# Patient Record
Sex: Female | Born: 1970 | Race: White | Hispanic: No | Marital: Married | Smoking: Former smoker
Health system: Southern US, Community
[De-identification: ages and names within clinical notes are randomized; demographics above are authoritative.]

## PROBLEM LIST (undated history)

## (undated) DIAGNOSIS — I10 Essential (primary) hypertension: Secondary | ICD-10-CM

## (undated) DIAGNOSIS — G4733 Obstructive sleep apnea (adult) (pediatric): Secondary | ICD-10-CM

## (undated) DIAGNOSIS — E669 Obesity, unspecified: Secondary | ICD-10-CM

## (undated) DIAGNOSIS — D170 Benign lipomatous neoplasm of skin and subcutaneous tissue of head, face and neck: Secondary | ICD-10-CM

## (undated) DIAGNOSIS — E039 Hypothyroidism, unspecified: Secondary | ICD-10-CM

## (undated) HISTORY — PX: TUBAL LIGATION: SHX77

## (undated) HISTORY — PX: LIPOMA EXCISION: SHX5283

## (undated) HISTORY — DX: Essential (primary) hypertension: I10

## (undated) HISTORY — PX: WISDOM TOOTH EXTRACTION: SHX21

## (undated) HISTORY — DX: Benign lipomatous neoplasm of skin and subcutaneous tissue of head, face and neck: D17.0

---

## 2017-05-13 DIAGNOSIS — Y838 Other surgical procedures as the cause of abnormal reaction of the patient, or of later complication, without mention of misadventure at the time of the procedure: Secondary | ICD-10-CM | POA: Diagnosis not present

## 2017-05-13 DIAGNOSIS — L7634 Postprocedural seroma of skin and subcutaneous tissue following other procedure: Secondary | ICD-10-CM | POA: Diagnosis not present

## 2017-06-13 DIAGNOSIS — L7634 Postprocedural seroma of skin and subcutaneous tissue following other procedure: Secondary | ICD-10-CM | POA: Diagnosis not present

## 2017-06-15 DIAGNOSIS — R232 Flushing: Secondary | ICD-10-CM | POA: Diagnosis not present

## 2017-06-15 DIAGNOSIS — N951 Menopausal and female climacteric states: Secondary | ICD-10-CM | POA: Diagnosis not present

## 2017-07-04 DIAGNOSIS — R221 Localized swelling, mass and lump, neck: Secondary | ICD-10-CM | POA: Diagnosis not present

## 2017-07-04 DIAGNOSIS — S1093XA Contusion of unspecified part of neck, initial encounter: Secondary | ICD-10-CM | POA: Diagnosis not present

## 2017-07-04 DIAGNOSIS — L7634 Postprocedural seroma of skin and subcutaneous tissue following other procedure: Secondary | ICD-10-CM | POA: Diagnosis not present

## 2017-07-08 DIAGNOSIS — R300 Dysuria: Secondary | ICD-10-CM | POA: Diagnosis not present

## 2017-07-25 DIAGNOSIS — N390 Urinary tract infection, site not specified: Secondary | ICD-10-CM | POA: Diagnosis not present

## 2017-07-25 DIAGNOSIS — M545 Low back pain: Secondary | ICD-10-CM | POA: Diagnosis not present

## 2017-07-25 DIAGNOSIS — R319 Hematuria, unspecified: Secondary | ICD-10-CM | POA: Diagnosis not present

## 2017-09-01 DIAGNOSIS — M25559 Pain in unspecified hip: Secondary | ICD-10-CM | POA: Diagnosis not present

## 2017-09-01 DIAGNOSIS — M545 Low back pain: Secondary | ICD-10-CM | POA: Diagnosis not present

## 2017-09-01 DIAGNOSIS — R39198 Other difficulties with micturition: Secondary | ICD-10-CM | POA: Diagnosis not present

## 2017-09-05 DIAGNOSIS — R102 Pelvic and perineal pain: Secondary | ICD-10-CM | POA: Diagnosis not present

## 2017-09-05 DIAGNOSIS — Z6835 Body mass index (BMI) 35.0-35.9, adult: Secondary | ICD-10-CM | POA: Diagnosis not present

## 2017-09-07 DIAGNOSIS — N83202 Unspecified ovarian cyst, left side: Secondary | ICD-10-CM | POA: Diagnosis not present

## 2017-09-07 DIAGNOSIS — R102 Pelvic and perineal pain: Secondary | ICD-10-CM | POA: Diagnosis not present

## 2017-09-07 DIAGNOSIS — D251 Intramural leiomyoma of uterus: Secondary | ICD-10-CM | POA: Diagnosis not present

## 2018-03-02 ENCOUNTER — Encounter: Payer: Self-pay | Admitting: Family Medicine

## 2018-03-02 ENCOUNTER — Ambulatory Visit (INDEPENDENT_AMBULATORY_CARE_PROVIDER_SITE_OTHER): Payer: BLUE CROSS/BLUE SHIELD | Admitting: Family Medicine

## 2018-03-02 VITALS — BP 104/70 | HR 66 | Temp 98.3°F | Ht 63.25 in | Wt 195.3 lb

## 2018-03-02 DIAGNOSIS — Z13220 Encounter for screening for lipoid disorders: Secondary | ICD-10-CM | POA: Diagnosis not present

## 2018-03-02 DIAGNOSIS — I10 Essential (primary) hypertension: Secondary | ICD-10-CM | POA: Diagnosis not present

## 2018-03-02 DIAGNOSIS — Z8679 Personal history of other diseases of the circulatory system: Secondary | ICD-10-CM

## 2018-03-02 DIAGNOSIS — N951 Menopausal and female climacteric states: Secondary | ICD-10-CM | POA: Insufficient documentation

## 2018-03-02 DIAGNOSIS — R51 Headache: Secondary | ICD-10-CM | POA: Diagnosis not present

## 2018-03-02 DIAGNOSIS — R519 Headache, unspecified: Secondary | ICD-10-CM

## 2018-03-02 MED ORDER — PREDNISONE 50 MG PO TABS: 50.0000 mg | ORAL_TABLET | Freq: Every day | ORAL | 0 refills | Status: DC

## 2018-03-02 NOTE — Progress Notes (Signed)
BP 104/70 (BP Location: Left Arm, Patient Position: Sitting, Cuff Size: Normal)   Pulse 66   Temp 98.3 F (36.8 C) (Oral)   Ht 5' 3.25" (1.607 m)   Wt 195 lb 4.8 oz (88.6 kg)   SpO2 98%   BMI 34.32 kg/m    Subjective:    Patient ID: Julia Jenkins, female    DOB: 08-15-1970, 47 y.o.   MRN: [id number]  HPI: Julia Jenkins is a 47 y.o. female who presents today to establish care.   Chief Complaint  Patient presents with  . Establish Care    Was taking lisinopril. Ever since stopped taking BP medication, patient has had a lot of pressure in her head.    Has been having pressure in her head for about a week. Has been throughout her head. Feels like she has a headache, but doesn't usually have headaches. No stuffiness, no runny nose. Has been waking up a lot at night. No weight changes.  No known history of allergies. No changes in vision, feeling entirely well except her pressure in her head  HYPERTENSION- was on lisinopril in the past, unsure of her dose, has been off her medicine for about a year now. Was on medicine for about 3 months about that time.  Hypertension status: stable  Satisfied with current treatment? no Duration of hypertension: chronic BP monitoring frequency:  Couple of times a week, highest has been 135/85 BP medication side effects:  no Medication compliance: poor compliance Previous BP meds: lisinopril Aspirin: no Recurrent headaches: no Visual changes: no Palpitations: no Dyspnea: no Chest pain: no Lower extremity edema: no Dizzy/lightheaded: no   Active Ambulatory Problems    Diagnosis Date Noted  . Perimenopause 03/02/2018   Resolved Ambulatory Problems    Diagnosis Date Noted  . No Resolved Ambulatory Problems   Past Medical History:  Diagnosis Date  . Hypertension   . Lipoma of neck    Past Surgical History:  Procedure Laterality Date  . CESAREAN SECTION    . LIPOMA EXCISION     Neck  . TUBAL LIGATION    . WISDOM TOOTH EXTRACTION       Outpatient Encounter Medications as of 03/02/2018  Medication Sig  . predniSONE (DELTASONE) 50 MG tablet Take 1 tablet (50 mg total) by mouth daily with breakfast.   No facility-administered encounter medications on file as of 03/02/2018.    No Known Allergies  Social History   Socioeconomic History  . Marital status: Married    Spouse name: Not on file  . Number of children: Not on file  . Years of education: Not on file  . Highest education level: Not on file  Occupational History  . Not on file  Social Needs  . Financial resource strain: Not on file  . Food insecurity:    Worry: Not on file    Inability: Not on file  . Transportation needs:    Medical: Not on file    Non-medical: Not on file  Tobacco Use  . Smoking status: Never Smoker  . Smokeless tobacco: Never Used  Substance and Sexual Activity  . Alcohol use: Yes    Comment: Once or twice a month  . Drug use: Never  . Sexual activity: Not on file  Lifestyle  . Physical activity:    Days per week: Not on file    Minutes per session: Not on file  . Stress: Not on file  Relationships  . Social connections:  Talks on phone: Not on file    Gets together: Not on file    Attends religious service: Not on file    Active member of club or organization: Not on file    Attends meetings of clubs or organizations: Not on file    Relationship status: Not on file  Other Topics Concern  . Not on file  Social History Narrative  . Not on file   Family History  Problem Relation Age of Onset  . Heart disease Maternal Grandmother   . Hypertension Maternal Grandmother   . Heart disease Maternal Grandfather   . Hypertension Maternal Grandfather   . Heart disease Paternal Grandmother   . Hypertension Paternal Grandmother   . Heart disease Paternal Grandfather   . Heart attack Paternal Grandfather   . Hypertension Paternal Grandfather   . Hyperlipidemia Mother   . Heart disease Father   . Heart attack Father    . Prostate cancer Father   . Hypertension Father   . Drug abuse Sister   . Depression Sister   . COPD Sister   . Drug abuse Sister   . Schizophrenia Sister   . Personality disorder Sister     Review of Systems  Constitutional: Positive for fatigue. Negative for activity change, appetite change, chills, diaphoresis, fever and unexpected weight change.  HENT: Positive for sinus pressure and tinnitus. Negative for congestion, dental problem, drooling, ear discharge, ear pain, facial swelling, hearing loss, mouth sores, nosebleeds, postnasal drip, rhinorrhea, sinus pain, sneezing, sore throat, trouble swallowing and voice change.   Eyes: Negative.   Respiratory: Negative.   Cardiovascular: Negative.   Gastrointestinal: Negative.   Musculoskeletal: Negative.   Psychiatric/Behavioral: Negative for agitation, behavioral problems, confusion, decreased concentration, dysphoric mood, hallucinations, self-injury, sleep disturbance and suicidal ideas. The patient is nervous/anxious. The patient is not hyperactive.     Per HPI unless specifically indicated above     Objective:    BP 104/70 (BP Location: Left Arm, Patient Position: Sitting, Cuff Size: Normal)   Pulse 66   Temp 98.3 F (36.8 C) (Oral)   Ht 5' 3.25" (1.607 m)   Wt 195 lb 4.8 oz (88.6 kg)   SpO2 98%   BMI 34.32 kg/m   Wt Readings from Last 3 Encounters:  03/02/18 195 lb 4.8 oz (88.6 kg)    Physical Exam  Constitutional: She is oriented to person, place, and time. She appears well-developed and well-nourished. No distress.  HENT:  Head: Normocephalic and atraumatic.  Right Ear: Hearing and external ear normal.  Left Ear: Hearing and external ear normal.  Nose: Mucosal edema present.  Mouth/Throat: Oropharynx is clear and moist. No oropharyngeal exudate.  Eyes: Pupils are equal, round, and reactive to light. Conjunctivae, EOM and lids are normal. Right eye exhibits no discharge. Left eye exhibits no discharge. No scleral  icterus.  Neck: Neck supple. No JVD present. No tracheal deviation present. No thyromegaly present.  Hypertonic paraspinal muscles  Cardiovascular: Normal rate, regular rhythm, normal heart sounds and intact distal pulses. Exam reveals no gallop and no friction rub.  No murmur heard. Pulmonary/Chest: Effort normal and breath sounds normal. No stridor. No respiratory distress. She has no wheezes. She has no rales. She exhibits no tenderness.  Musculoskeletal: Normal range of motion.  Lymphadenopathy:    She has no cervical adenopathy.  Neurological: She is alert and oriented to person, place, and time.  Skin: Skin is warm, dry and intact. Capillary refill takes less than 2 seconds. No rash  noted. She is not diaphoretic. No erythema. No pallor.  Psychiatric: She has a normal mood and affect. Her speech is normal and behavior is normal. Judgment and thought content normal. Cognition and memory are normal.    No results found for this or any previous visit.    Assessment & Plan:   Problem List Items Addressed This Visit      Other   Perimenopause    To be doing a clinical study with [facility]. Stable right now. Continue to monitor. Call with any concerns.        Other Visit Diagnoses    Pressure in head    -  Primary   Unlikely due to BP. Will treat with burst of prednisone for ?allergies. Call if not getting better or getting worse. Continue to monitor.   History of hypertension       BP perfect today. Unlikely cause of pressure in her head. Checking labs.    Relevant Orders   CBC with Differential/Platelet   Comprehensive metabolic panel   Microalbumin, Urine Waived   TSH   UA/M w/rflx Culture, Routine   Screening for cholesterol level       Checking labs today.   Relevant Orders   Lipid Panel w/o Chol/HDL Ratio       Follow up plan: Return in about 6 months (around 08/31/2018) for Physical; Records release.

## 2018-03-02 NOTE — Assessment & Plan Note (Signed)
To be doing a clinical study with [facility]. Stable right now. Continue to monitor. Call with any concerns.

## 2018-03-03 ENCOUNTER — Telehealth: Payer: Self-pay | Admitting: Family Medicine

## 2018-03-03 LAB — UA/M W/RFLX CULTURE, ROUTINE
BILIRUBIN UA: NEGATIVE
GLUCOSE, UA: NEGATIVE
Leukocytes, UA: NEGATIVE
Nitrite, UA: NEGATIVE
PH UA: 5.5 (ref 5.0–7.5)
Protein, UA: NEGATIVE
RBC, UA: NEGATIVE
Specific Gravity, UA: 1.025 (ref 1.005–1.030)
UUROB: 0.2 mg/dL (ref 0.2–1.0)

## 2018-03-03 LAB — CBC WITH DIFFERENTIAL/PLATELET
Basophils Absolute: 0 10*3/uL (ref 0.0–0.2)
Basos: 1 %
EOS (ABSOLUTE): 0.1 10*3/uL (ref 0.0–0.4)
Eos: 2 %
Hematocrit: 40.1 % (ref 34.0–46.6)
Hemoglobin: 13.4 g/dL (ref 11.1–15.9)
IMMATURE GRANS (ABS): 0 10*3/uL (ref 0.0–0.1)
Immature Granulocytes: 0 %
LYMPHS ABS: 1.3 10*3/uL (ref 0.7–3.1)
LYMPHS: 23 %
MCH: 28.7 pg (ref 26.6–33.0)
MCHC: 33.4 g/dL (ref 31.5–35.7)
MCV: 86 fL (ref 79–97)
Monocytes Absolute: 0.6 10*3/uL (ref 0.1–0.9)
Monocytes: 11 %
NEUTROS ABS: 3.7 10*3/uL (ref 1.4–7.0)
Neutrophils: 63 %
PLATELETS: 266 10*3/uL (ref 150–450)
RBC: 4.67 x10E6/uL (ref 3.77–5.28)
RDW: 13.5 % (ref 12.3–15.4)
WBC: 5.8 10*3/uL (ref 3.4–10.8)

## 2018-03-03 LAB — COMPREHENSIVE METABOLIC PANEL
ALK PHOS: 95 IU/L (ref 39–117)
ALT: 19 IU/L (ref 0–32)
AST: 18 IU/L (ref 0–40)
Albumin/Globulin Ratio: 1.7 (ref 1.2–2.2)
Albumin: 4 g/dL (ref 3.5–5.5)
BILIRUBIN TOTAL: 0.2 mg/dL (ref 0.0–1.2)
BUN/Creatinine Ratio: 22 (ref 9–23)
BUN: 20 mg/dL (ref 6–24)
CHLORIDE: 104 mmol/L (ref 96–106)
CO2: 22 mmol/L (ref 20–29)
Calcium: 9 mg/dL (ref 8.7–10.2)
Creatinine, Ser: 0.91 mg/dL (ref 0.57–1.00)
GFR calc Af Amer: 86 mL/min/{1.73_m2} (ref >59)
GFR calc non Af Amer: 74 mL/min/{1.73_m2} (ref >59)
GLUCOSE: 86 mg/dL (ref 65–99)
Globulin, Total: 2.4 g/dL (ref 1.5–4.5)
Potassium: 4.3 mmol/L (ref 3.5–5.2)
Sodium: 141 mmol/L (ref 134–144)
Total Protein: 6.4 g/dL (ref 6.0–8.5)

## 2018-03-03 LAB — LIPID PANEL W/O CHOL/HDL RATIO
CHOLESTEROL TOTAL: 217 mg/dL — AB (ref 100–199)
HDL: 56 mg/dL (ref >39)
LDL Calculated: 140 mg/dL — ABNORMAL HIGH (ref 0–99)
Triglycerides: 105 mg/dL (ref 0–149)
VLDL CHOLESTEROL CAL: 21 mg/dL (ref 5–40)

## 2018-03-03 LAB — MICROALBUMIN, URINE WAIVED
Creatinine, Urine Waived: 200 mg/dL (ref 10–300)
Microalb, Ur Waived: 10 mg/L (ref 0–19)
Microalb/Creat Ratio: <30 mg/g (ref <30)

## 2018-03-03 LAB — TSH: TSH: 3.81 u[IU]/mL (ref 0.450–4.500)

## 2018-03-03 NOTE — Telephone Encounter (Signed)
Called patient, no answer, left a message for patient to return my call. [place] for nurse triage to discuss results. CRM created.

## 2018-03-03 NOTE — Telephone Encounter (Signed)
Please let her know that her labs came back nice and normal. Her cholesterol was a tiny bit high but not to where we'd put her on medicine. Everything else looks good.

## 2018-03-03 NOTE — Telephone Encounter (Signed)
Pt returned call and lab message given to her with verbal understanding.  

## 2018-03-07 ENCOUNTER — Telehealth: Payer: Self-pay | Admitting: Family Medicine

## 2018-03-07 NOTE — Telephone Encounter (Signed)
-----   Message from Valerie Roys, [place] sent at 03/02/2018  4:03 PM EDT ----- Call about head pressure

## 2018-03-07 NOTE — Telephone Encounter (Signed)
Called to check in on the pressure on her head. She notes that she has been spotting all week and is wondering if it has anything to do with that. Prednisone didn't really seem to help. She is otherwise feeling well with no other concerns at this time.

## 2018-05-16 ENCOUNTER — Other Ambulatory Visit: Payer: Self-pay

## 2018-05-16 ENCOUNTER — Encounter: Payer: Self-pay | Admitting: Family Medicine

## 2018-05-16 ENCOUNTER — Ambulatory Visit: Payer: BLUE CROSS/BLUE SHIELD | Admitting: Family Medicine

## 2018-05-16 VITALS — BP 130/85 | HR 62 | Temp 98.5°F | Ht 64.0 in | Wt 203.0 lb

## 2018-05-16 DIAGNOSIS — R229 Localized swelling, mass and lump, unspecified: Secondary | ICD-10-CM

## 2018-05-16 DIAGNOSIS — IMO0002 Reserved for concepts with insufficient information to code with codable children: Secondary | ICD-10-CM

## 2018-05-16 MED ORDER — NAPROXEN 500 MG PO TABS: 500.0000 mg | ORAL_TABLET | Freq: Two times a day (BID) | ORAL | 1 refills | Status: DC

## 2018-05-16 NOTE — Progress Notes (Signed)
BP 130/85   Pulse 62   Temp 98.5 F (36.9 C) (Oral)   Ht 5\' 4"  (1.626 m)   Wt 203 lb (92.1 kg)   SpO2 99%   BMI 34.84 kg/m    Subjective:    Patient ID: Julia Jenkins, female    DOB: October 04, 1970, 48 y.o.   MRN: [id number]  HPI: Julia Jenkins is a 48 y.o. female  Chief Complaint  Patient presents with  . Cyst    pt states has had a knot on her middle back for about 6 moth. states it hurts when put pressure on it   LUMP Duration: about 6 months ago Location: middle back Onset: gradual Painful: only with pushing on it Discomfort: yes Status:  bigger Trauma: no Redness: no Bruising: no Recent infection: no Swollen lymph nodes: no Requesting removal: yes History of cancer: no Family history of cancer: no History of the same: no   Relevant past medical, surgical, family and social history reviewed and updated as indicated. Interim medical history since our last visit reviewed. Allergies and medications reviewed and updated.  Review of Systems  Constitutional: Negative.   Respiratory: Negative.   Cardiovascular: Negative.   Musculoskeletal: Positive for arthralgias and myalgias. Negative for back pain, gait problem, joint swelling, neck pain and neck stiffness.  Skin: Negative for color change, pallor, rash and wound.  Psychiatric/Behavioral: Negative.     Per HPI unless specifically indicated above     Objective:    BP 130/85   Pulse 62   Temp 98.5 F (36.9 C) (Oral)   Ht 5\' 4"  (1.626 m)   Wt 203 lb (92.1 kg)   SpO2 99%   BMI 34.84 kg/m   Wt Readings from Last 3 Encounters:  05/16/18 203 lb (92.1 kg)  03/02/18 195 lb 4.8 oz (88.6 kg)    Physical Exam Vitals signs and nursing note reviewed.  Constitutional:      General: She is not in acute distress.    Appearance: Normal appearance. She is not ill-appearing, toxic-appearing or diaphoretic.  HENT:     Head: Normocephalic and atraumatic.     Right Ear: External ear normal.     Left Ear: External ear  normal.     Nose: Nose normal.     Mouth/Throat:     Mouth: Mucous membranes are moist.     Pharynx: Oropharynx is clear.  Eyes:     General: No scleral icterus.       Right eye: No discharge.        Left eye: No discharge.     Extraocular Movements: Extraocular movements intact.     Conjunctiva/sclera: Conjunctivae normal.     Pupils: Pupils are equal, round, and reactive to light.  Neck:     Musculoskeletal: Normal range of motion and neck supple.  Cardiovascular:     Rate and Rhythm: Normal rate and regular rhythm.     Pulses: Normal pulses.     Heart sounds: Normal heart sounds. No murmur. No friction rub. No gallop.   Pulmonary:     Effort: Pulmonary effort is normal. No respiratory distress.     Breath sounds: Normal breath sounds. No stridor. No wheezing, rhonchi or rales.  Chest:     Chest wall: No tenderness.  Musculoskeletal: Normal range of motion.  Skin:    General: Skin is warm and dry.     Capillary Refill: Capillary refill takes less than 2 seconds.     Coloration: Skin is not  jaundiced or pale.     Findings: No bruising, erythema, lesion or rash.     Comments: 1 inch lump in deep tissue about T9 midline back- slightly more to the R than L  Neurological:     General: No focal deficit present.     Mental Status: She is alert and oriented to person, place, and time. Mental status is at baseline.  Psychiatric:        Mood and Affect: Mood normal.        Behavior: Behavior normal.        Thought Content: Thought content normal.        Judgment: Judgment normal.     Results for orders placed or performed in visit on 03/02/18  CBC with Differential/Platelet  Result Value Ref Range   WBC 5.8 3.4 - 10.8 x10E3/uL   RBC 4.67 3.77 - 5.28 x10E6/uL   Hemoglobin 13.4 11.1 - 15.9 g/dL   Hematocrit 40.1 34.0 - 46.6 %   MCV 86 79 - 97 fL   MCH 28.7 26.6 - 33.0 pg   MCHC 33.4 31.5 - 35.7 g/dL   RDW 13.5 12.3 - 15.4 %   Platelets 266 150 - 450 x10E3/uL   Neutrophils  63 Not Estab. %   Lymphs 23 Not Estab. %   Monocytes 11 Not Estab. %   Eos 2 Not Estab. %   Basos 1 Not Estab. %   Neutrophils Absolute 3.7 1.4 - 7.0 x10E3/uL   Lymphocytes Absolute 1.3 0.7 - 3.1 x10E3/uL   Monocytes Absolute 0.6 0.1 - 0.9 x10E3/uL   EOS (ABSOLUTE) 0.1 0.0 - 0.4 x10E3/uL   Basophils Absolute 0.0 0.0 - 0.2 x10E3/uL   Immature Granulocytes 0 Not Estab. %   Immature Grans (Abs) 0.0 0.0 - 0.1 x10E3/uL  Comprehensive metabolic panel  Result Value Ref Range   Glucose 86 65 - 99 mg/dL   BUN 20 6 - 24 mg/dL   Creatinine, Ser 0.91 0.57 - 1.00 mg/dL   GFR calc non Af Amer 74 >59 mL/min/1.73   GFR calc Af Amer 86 >59 mL/min/1.73   BUN/Creatinine Ratio 22 9 - 23   Sodium 141 134 - 144 mmol/L   Potassium 4.3 3.5 - 5.2 mmol/L   Chloride 104 96 - 106 mmol/L   CO2 22 20 - 29 mmol/L   Calcium 9.0 8.7 - 10.2 mg/dL   Total Protein 6.4 6.0 - 8.5 g/dL   Albumin 4.0 3.5 - 5.5 g/dL   Globulin, Total 2.4 1.5 - 4.5 g/dL   Albumin/Globulin Ratio 1.7 1.2 - 2.2   Bilirubin Total 0.2 0.0 - 1.2 mg/dL   Alkaline Phosphatase 95 39 - 117 IU/L   AST 18 0 - 40 IU/L   ALT 19 0 - 32 IU/L  Lipid Panel w/o Chol/HDL Ratio  Result Value Ref Range   Cholesterol, Total 217 (H) 100 - 199 mg/dL   Triglycerides 105 0 - 149 mg/dL   HDL 56 >39 mg/dL   VLDL Cholesterol Cal 21 5 - 40 mg/dL   LDL Calculated 140 (H) 0 - 99 mg/dL  Microalbumin, Urine Waived  Result Value Ref Range   Microalb, Ur Waived 10 0 - 19 mg/L   Creatinine, Urine Waived 200 10 - 300 mg/dL   Microalb/Creat Ratio <30 <30 mg/g  TSH  Result Value Ref Range   TSH 3.810 0.450 - 4.500 uIU/mL  UA/M w/rflx Culture, Routine  Result Value Ref Range   Specific Gravity, UA 1.025 1.005 -  1.030   pH, UA 5.5 5.0 - 7.5   Color, UA Yellow Yellow   Appearance Ur Clear Clear   Leukocytes, UA Negative Negative   Protein, UA Negative Negative/Trace   Glucose, UA Negative Negative   Ketones, UA Trace (A) Negative   RBC, UA Negative Negative    Bilirubin, UA Negative Negative   Urobilinogen, Ur 0.2 0.2 - 1.0 mg/dL   Nitrite, UA Negative Negative      Assessment & Plan:   Problem List Items Addressed This Visit    None    Visit Diagnoses    Lump    -  Primary   Deep in tissue about 1 inch round. Will get [place] to evaluate. Will send to surgery if lipoma. Call with any concerns.    Relevant Orders   [place] CHEST SOFT TISSUE       Follow up plan: Return April, for physical.

## 2018-05-24 ENCOUNTER — Ambulatory Visit: Payer: BLUE CROSS/BLUE SHIELD

## 2018-05-31 ENCOUNTER — Ambulatory Visit
Admission: RE | Admit: 2018-05-31 | Discharge: 2018-05-31 | Disposition: A | Discharge status: Home / Self Care | Payer: BLUE CROSS/BLUE SHIELD | Source: Ambulatory Visit | Attending: Family Medicine | Admitting: Family Medicine

## 2018-05-31 DIAGNOSIS — R221 Localized swelling, mass and lump, neck: Secondary | ICD-10-CM | POA: Diagnosis not present

## 2018-05-31 DIAGNOSIS — R229 Localized swelling, mass and lump, unspecified: Secondary | ICD-10-CM | POA: Insufficient documentation

## 2018-05-31 DIAGNOSIS — IMO0002 Reserved for concepts with insufficient information to code with codable children: Secondary | ICD-10-CM

## 2018-06-01 ENCOUNTER — Telehealth: Payer: Self-pay | Admitting: Family Medicine

## 2018-06-01 DIAGNOSIS — M899 Disorder of bone, unspecified: Secondary | ICD-10-CM

## 2018-06-01 DIAGNOSIS — IMO0002 Reserved for concepts with insufficient information to code with codable children: Secondary | ICD-10-CM

## 2018-06-01 DIAGNOSIS — R229 Localized swelling, mass and lump, unspecified: Principal | ICD-10-CM

## 2018-06-01 NOTE — Telephone Encounter (Signed)
Called and left a detailed message for patient with results.  Dr.J- the radiologist said to do a MRI of the spine with and without contrast- either thoracic or lumbar depending on where the mass is located on the spine.

## 2018-06-01 NOTE — Telephone Encounter (Signed)
Order placed

## 2018-06-01 NOTE — Telephone Encounter (Signed)
Please let her know that her [place] looks like there was a mass there, but they can't tell what it was. I'm going to order an MRI so we can figure out what it is.   It's in the middle of the soft tissue of her back- can we call radiology and ask them what I should order to get that looked at. Thanks!

## 2018-06-16 ENCOUNTER — Encounter: Payer: Self-pay | Admitting: Family Medicine

## 2018-06-16 ENCOUNTER — Ambulatory Visit: Payer: BLUE CROSS/BLUE SHIELD | Admitting: Family Medicine

## 2018-06-16 ENCOUNTER — Other Ambulatory Visit: Payer: Self-pay

## 2018-06-16 VITALS — BP 122/81 | HR 59 | Temp 97.8°F | Ht 63.0 in | Wt 207.0 lb

## 2018-06-16 DIAGNOSIS — R1013 Epigastric pain: Secondary | ICD-10-CM | POA: Diagnosis not present

## 2018-06-16 DIAGNOSIS — N951 Menopausal and female climacteric states: Secondary | ICD-10-CM

## 2018-06-16 MED ORDER — SUCRALFATE 1 GM/10ML PO SUSP: 1.0000 g | Freq: Three times a day (TID) | ORAL | 0 refills | Status: DC

## 2018-06-16 NOTE — Assessment & Plan Note (Signed)
Heavy period today. Will monitor closely- if not getting better or getting worse, will get her into GYN.

## 2018-06-16 NOTE — Progress Notes (Signed)
BP 122/81   Pulse (!) 59   Temp 97.8 F (36.6 C) (Oral)   Ht 5\' 3"  (1.6 m)   Wt 207 lb (93.9 kg)   SpO2 100%   BMI 36.67 kg/m    Subjective:    Patient ID: Julia Jenkins, female    DOB: [birth year], 48 y.o.   MRN: [id number]  HPI: Julia Jenkins is a 48 y.o. female  Chief Complaint  Patient presents with  . Abdominal Pain    upper abdominal pain since last Friday  . Menstrual Problem    heavy menstrual period started on Monday   . Hands shaking    bilateral for about a week   Hadn't had a period in about 6-8 months and started on Monday, has been having a lot of discomfort with heavy bleeding and cramping. Notes that it is not a good feeling, and she is not feeling well at all with it.   ABDOMINAL PAIN  Duration: couple of days ago Onset: sudden Severity: severe Quality: sharp, stabbing Location:  epigastric  Episode duration: few minutes Radiation: no Frequency: waxing and waning Alleviating factors: pain medicine Aggravating factors: nothing Status: stable Treatments attempted: none Fever: no Nausea: yes Vomiting: no Weight loss: no Decreased appetite: yes Diarrhea: no Constipation: no Blood in stool: no Heartburn: yes Jaundice: no Rash: no Dysuria/urinary frequency: no Hematuria: no History of sexually transmitted disease: no Recurrent NSAID use: no   Relevant past medical, surgical, family and social history reviewed and updated as indicated. Interim medical history since our last visit reviewed. Allergies and medications reviewed and updated.  Review of Systems  Constitutional: Negative.   Respiratory: Negative.   Cardiovascular: Negative.   Genitourinary: Positive for menstrual problem, pelvic pain and vaginal bleeding. Negative for decreased urine volume, difficulty urinating, dyspareunia, dysuria, enuresis, flank pain, frequency, genital sores, hematuria, urgency, vaginal discharge and vaginal pain.  Musculoskeletal: Negative.   Neurological:  Positive for numbness. Negative for dizziness, tremors, seizures, syncope, facial asymmetry, speech difficulty, weakness, light-headedness and headaches.  Psychiatric/Behavioral: Negative.     Per HPI unless specifically indicated above     Objective:    BP 122/81   Pulse (!) 59   Temp 97.8 F (36.6 C) (Oral)   Ht 5\' 3"  (1.6 m)   Wt 207 lb (93.9 kg)   SpO2 100%   BMI 36.67 kg/m   Wt Readings from Last 3 Encounters:  06/16/18 207 lb (93.9 kg)  05/16/18 203 lb (92.1 kg)  03/02/18 195 lb 4.8 oz (88.6 kg)    Physical Exam Vitals signs and nursing note reviewed.  Constitutional:      General: She is not in acute distress.    Appearance: Normal appearance. She is not ill-appearing, toxic-appearing or diaphoretic.  HENT:     Head: Normocephalic and atraumatic.     Right Ear: External ear normal.     Left Ear: External ear normal.     Nose: Nose normal.     Mouth/Throat:     Mouth: Mucous membranes are moist.     Pharynx: Oropharynx is clear.  Eyes:     General: No scleral icterus.       Right eye: No discharge.        Left eye: No discharge.     Extraocular Movements: Extraocular movements intact.     Conjunctiva/sclera: Conjunctivae normal.     Pupils: Pupils are equal, round, and reactive to light.  Neck:     Musculoskeletal: Normal range of motion  and neck supple.  Cardiovascular:     Rate and Rhythm: Normal rate and regular rhythm.     Pulses: Normal pulses.     Heart sounds: Normal heart sounds. No murmur. No friction rub. No gallop.   Pulmonary:     Effort: Pulmonary effort is normal. No respiratory distress.     Breath sounds: Normal breath sounds. No stridor. No wheezing, rhonchi or rales.  Chest:     Chest wall: No tenderness.  Abdominal:     General: Abdomen is flat. Bowel sounds are normal. There is no distension or abdominal bruit. There are no signs of injury.     Palpations: Abdomen is soft. There is no shifting dullness, fluid wave, hepatomegaly,  splenomegaly, mass or pulsatile mass.     Tenderness: There is abdominal tenderness in the epigastric area. There is no right CVA tenderness, left CVA tenderness, guarding or rebound. Negative signs include Murphy's sign, Rovsing's sign, McBurney's sign, psoas sign and obturator sign.  Musculoskeletal: Normal range of motion.  Skin:    General: Skin is warm and dry.     Capillary Refill: Capillary refill takes less than 2 seconds.     Coloration: Skin is not jaundiced or pale.     Findings: No bruising, erythema, lesion or rash.  Neurological:     General: No focal deficit present.     Mental Status: She is alert and oriented to person, place, and time. Mental status is at baseline.  Psychiatric:        Mood and Affect: Mood normal.        Behavior: Behavior normal.        Thought Content: Thought content normal.        Judgment: Judgment normal.     Results for orders placed or performed in visit on 03/02/18  CBC with Differential/Platelet  Result Value Ref Range   WBC 5.8 3.4 - 10.8 x10E3/uL   RBC 4.67 3.77 - 5.28 x10E6/uL   Hemoglobin 13.4 11.1 - 15.9 g/dL   Hematocrit 40.1 34.0 - 46.6 %   MCV 86 79 - 97 fL   MCH 28.7 26.6 - 33.0 pg   MCHC 33.4 31.5 - 35.7 g/dL   RDW 13.5 12.3 - 15.4 %   Platelets 266 150 - 450 x10E3/uL   Neutrophils 63 Not Estab. %   Lymphs 23 Not Estab. %   Monocytes 11 Not Estab. %   Eos 2 Not Estab. %   Basos 1 Not Estab. %   Neutrophils Absolute 3.7 1.4 - 7.0 x10E3/uL   Lymphocytes Absolute 1.3 0.7 - 3.1 x10E3/uL   Monocytes Absolute 0.6 0.1 - 0.9 x10E3/uL   EOS (ABSOLUTE) 0.1 0.0 - 0.4 x10E3/uL   Basophils Absolute 0.0 0.0 - 0.2 x10E3/uL   Immature Granulocytes 0 Not Estab. %   Immature Grans (Abs) 0.0 0.0 - 0.1 x10E3/uL  Comprehensive metabolic panel  Result Value Ref Range   Glucose 86 65 - 99 mg/dL   BUN 20 6 - 24 mg/dL   Creatinine, Ser 0.91 0.57 - 1.00 mg/dL   GFR calc non Af Amer 74 >59 mL/min/1.73   GFR calc Af Amer 86 >59 mL/min/1.73     BUN/Creatinine Ratio 22 9 - 23   Sodium 141 134 - 144 mmol/L   Potassium 4.3 3.5 - 5.2 mmol/L   Chloride 104 96 - 106 mmol/L   CO2 22 20 - 29 mmol/L   Calcium 9.0 8.7 - 10.2 mg/dL   Total Protein 6.4  6.0 - 8.5 g/dL   Albumin 4.0 3.5 - 5.5 g/dL   Globulin, Total 2.4 1.5 - 4.5 g/dL   Albumin/Globulin Ratio 1.7 1.2 - 2.2   Bilirubin Total 0.2 0.0 - 1.2 mg/dL   Alkaline Phosphatase 95 39 - 117 IU/L   AST 18 0 - 40 IU/L   ALT 19 0 - 32 IU/L  Lipid Panel w/o Chol/HDL Ratio  Result Value Ref Range   Cholesterol, Total 217 (H) 100 - 199 mg/dL   Triglycerides 105 0 - 149 mg/dL   HDL 56 >39 mg/dL   VLDL Cholesterol Cal 21 5 - 40 mg/dL   LDL Calculated 140 (H) 0 - 99 mg/dL  Microalbumin, Urine Waived  Result Value Ref Range   Microalb, Ur Waived 10 0 - 19 mg/L   Creatinine, Urine Waived 200 10 - 300 mg/dL   Microalb/Creat Ratio <30 <30 mg/g  TSH  Result Value Ref Range   TSH 3.810 0.450 - 4.500 uIU/mL  UA/M w/rflx Culture, Routine  Result Value Ref Range   Specific Gravity, UA 1.025 1.005 - 1.030   pH, UA 5.5 5.0 - 7.5   Color, UA Yellow Yellow   Appearance Ur Clear Clear   Leukocytes, UA Negative Negative   Protein, UA Negative Negative/Trace   Glucose, UA Negative Negative   Ketones, UA Trace (A) Negative   RBC, UA Negative Negative   Bilirubin, UA Negative Negative   Urobilinogen, Ur 0.2 0.2 - 1.0 mg/dL   Nitrite, UA Negative Negative      Assessment & Plan:   Problem List Items Addressed This Visit      Other   Perimenopause    Heavy period today. Will monitor closely- if not getting better or getting worse, will get her into GYN.       Other Visit Diagnoses    Epigastric abdominal pain    -  Primary   Sounds like gastritis. Will check labs and start carafate. Call with any concerns. Await results.    Relevant Orders   CBC with Differential/Platelet   Comprehensive metabolic panel   H. pylori antibody, IgG([facility])       Follow up plan: Return if  symptoms worsen or fail to improve.

## 2018-06-19 ENCOUNTER — Telehealth: Payer: Self-pay | Admitting: Family Medicine

## 2018-06-19 ENCOUNTER — Ambulatory Visit
Admission: RE | Admit: 2018-06-19 | Discharge: 2018-06-19 | Disposition: A | Discharge status: Home / Self Care | Payer: BLUE CROSS/BLUE SHIELD | Source: Ambulatory Visit | Attending: Family Medicine | Admitting: Family Medicine

## 2018-06-19 DIAGNOSIS — R229 Localized swelling, mass and lump, unspecified: Secondary | ICD-10-CM | POA: Diagnosis not present

## 2018-06-19 DIAGNOSIS — M899 Disorder of bone, unspecified: Secondary | ICD-10-CM | POA: Diagnosis not present

## 2018-06-19 DIAGNOSIS — IMO0002 Reserved for concepts with insufficient information to code with codable children: Secondary | ICD-10-CM

## 2018-06-19 DIAGNOSIS — D171 Benign lipomatous neoplasm of skin and subcutaneous tissue of trunk: Secondary | ICD-10-CM

## 2018-06-19 DIAGNOSIS — M5124 Other intervertebral disc displacement, thoracic region: Secondary | ICD-10-CM | POA: Diagnosis not present

## 2018-06-19 LAB — CBC WITH DIFFERENTIAL/PLATELET
BASOS ABS: 0 10*3/uL (ref 0.0–0.2)
Basos: 1 %
EOS (ABSOLUTE): 0.1 10*3/uL (ref 0.0–0.4)
Eos: 3 %
Hematocrit: 39.2 % (ref 34.0–46.6)
Hemoglobin: 12.9 g/dL (ref 11.1–15.9)
Immature Grans (Abs): 0 10*3/uL (ref 0.0–0.1)
Immature Granulocytes: 0 %
Lymphocytes Absolute: 1 10*3/uL (ref 0.7–3.1)
Lymphs: 26 %
MCH: 29 pg (ref 26.6–33.0)
MCHC: 32.9 g/dL (ref 31.5–35.7)
MCV: 88 fL (ref 79–97)
Monocytes Absolute: 0.5 10*3/uL (ref 0.1–0.9)
Monocytes: 12 %
Neutrophils Absolute: 2.3 10*3/uL (ref 1.4–7.0)
Neutrophils: 58 %
Platelets: 227 10*3/uL (ref 150–450)
RBC: 4.45 x10E6/uL (ref 3.77–5.28)
RDW: 13.3 % (ref 11.7–15.4)
WBC: 4 10*3/uL (ref 3.4–10.8)

## 2018-06-19 LAB — COMPREHENSIVE METABOLIC PANEL
ALT: 15 IU/L (ref 0–32)
AST: 12 IU/L (ref 0–40)
Albumin/Globulin Ratio: 2.2 (ref 1.2–2.2)
Albumin: 4.1 g/dL (ref 3.8–4.8)
Alkaline Phosphatase: 85 IU/L (ref 39–117)
BUN/Creatinine Ratio: 30 — ABNORMAL HIGH (ref 9–23)
BUN: 19 mg/dL (ref 6–24)
Bilirubin Total: 0.2 mg/dL (ref 0.0–1.2)
CALCIUM: 9.1 mg/dL (ref 8.7–10.2)
CO2: 23 mmol/L (ref 20–29)
Chloride: 104 mmol/L (ref 96–106)
Creatinine, Ser: 0.64 mg/dL (ref 0.57–1.00)
GFR calc Af Amer: 121 mL/min/{1.73_m2} (ref >59)
GFR calc non Af Amer: 105 mL/min/{1.73_m2} (ref >59)
Globulin, Total: 1.9 g/dL (ref 1.5–4.5)
Glucose: 93 mg/dL (ref 65–99)
Potassium: 4.4 mmol/L (ref 3.5–5.2)
Sodium: 139 mmol/L (ref 134–144)
Total Protein: 6 g/dL (ref 6.0–8.5)

## 2018-06-19 LAB — H. PYLORI ANTIBODY, IGG: H. pylori, IgG AbS: 0.18 Index Value (ref 0.00–0.79)

## 2018-06-19 MED ORDER — GADOBUTROL 1 MMOL/ML IV SOLN
9.0000 mL | Freq: Once | INTRAVENOUS | Status: AC | PRN
  Administered 2018-06-19: 9 mL via INTRAVENOUS

## 2018-06-19 NOTE — Telephone Encounter (Signed)
Referral placed.

## 2018-06-19 NOTE — Telephone Encounter (Signed)
Patient notified of all results. Patient states she would like to go ahead and have the lipoma removed from her back.

## 2018-06-19 NOTE — Telephone Encounter (Signed)
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 I'm happy to refer her to surgery to have it removed if she would like (order pended).  Her labs came back normal. No sign of issues with bacteria in her gut and her blood count, electrolytes and liver function are all normal. She can keep taking the medicine and we'll see how she does. Thanks!

## 2018-06-26 ENCOUNTER — Ambulatory Visit: Payer: Self-pay | Admitting: Surgery

## 2018-11-24 ENCOUNTER — Ambulatory Visit: Payer: BC Managed Care – PPO | Admitting: Family Medicine

## 2018-11-24 ENCOUNTER — Encounter: Payer: Self-pay | Admitting: Family Medicine

## 2018-11-24 ENCOUNTER — Other Ambulatory Visit: Payer: Self-pay

## 2018-11-24 VITALS — BP 128/86 | HR 81 | Temp 98.4°F

## 2018-11-24 DIAGNOSIS — M25531 Pain in right wrist: Secondary | ICD-10-CM

## 2018-11-24 MED ORDER — TRIAMCINOLONE ACETONIDE 40 MG/ML IJ SUSP
40.0000 mg | Freq: Once | INTRAMUSCULAR | Status: AC
  Administered 2018-11-24: 40 mg via INTRAMUSCULAR

## 2018-11-24 MED ORDER — CYCLOBENZAPRINE HCL 5 MG PO TABS: 5.0000 mg | ORAL_TABLET | Freq: Every evening | ORAL | 0 refills | Status: DC | PRN

## 2018-11-24 NOTE — Progress Notes (Signed)
BP 128/86   Pulse 81   Temp 98.4 F (36.9 C) (Oral)   SpO2 98%    Subjective:    Patient ID: Julia Jenkins, female    DOB: 1971/01/25, 48 y.o.   MRN: [id number]  HPI: Julia Jenkins is a 48 y.o. female  Chief Complaint  Patient presents with  . Wrist Pain    R wrist, pt states she has been making lots of meals for people and has been hurting her wrist for a couple of weeks now.    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 Right wrist pain and lateral inflammation the past few weeks. Feels some muscle soreness going up into forearm additionally. No numbness, tingling, weakness, redness, heat, chills, fevers. Trying braces, massage, ice with minimal relief. Has not been taking anything OTC for sxs. Never happened to her before.   Relevant past medical, surgical, family and social history reviewed and updated as indicated. Interim medical history since our last visit reviewed. Allergies and medications reviewed and updated.  Review of Systems  Per HPI unless specifically indicated above     Objective:    BP 128/86   Pulse 81   Temp 98.4 F (36.9 C) (Oral)   SpO2 98%   Wt Readings from Last 3 Encounters:  06/16/18 207 lb (93.9 kg)  05/16/18 203 lb (92.1 kg)  03/02/18 195 lb 4.8 oz (88.6 kg)    Physical Exam Vitals signs and nursing note reviewed.  Constitutional:      Appearance: Normal appearance. She is not ill-appearing.  HENT:     Head: Atraumatic.  Eyes:     Extraocular Movements: Extraocular movements intact.     Conjunctiva/sclera: Conjunctivae normal.  Neck:     Musculoskeletal: Normal range of motion and neck supple.  Cardiovascular:     Rate and Rhythm: Normal rate and regular rhythm.     Pulses: Normal pulses.     Heart sounds: Normal heart sounds.  Pulmonary:     Effort: Pulmonary effort is normal.     Breath sounds: Normal breath sounds.  Musculoskeletal: Normal range of motion.      General: Swelling (right lateral wrist swelling present) and tenderness (right lateral wrist extending into forearm ttp) present. No signs of injury.  Skin:    General: Skin is warm and dry.  Neurological:     Mental Status: She is alert and oriented to person, place, and time.     Motor: No weakness.  Psychiatric:        Mood and Affect: Mood normal.        Thought Content: Thought content normal.        Judgment: Judgment normal.     Results for orders placed or performed in visit on 06/16/18  CBC with Differential/Platelet  Result Value Ref Range   WBC 4.0 3.4 - 10.8 x10E3/uL   RBC 4.45 3.77 - 5.28 x10E6/uL   Hemoglobin 12.9 11.1 - 15.9 g/dL   Hematocrit 39.2 34.0 - 46.6 %   MCV 88 79 - 97 fL   MCH 29.0 26.6 - 33.0 pg   MCHC 32.9 31.5 - 35.7 g/dL   RDW 13.3 11.7 - 15.4 %   Platelets 227 150 - 450 x10E3/uL   Neutrophils 58 Not Estab. %   Lymphs 26 Not Estab. %   Monocytes 12 Not Estab. %   Eos 3 Not Estab. %   Basos 1 Not Estab. %  Neutrophils Absolute 2.3 1.4 - 7.0 x10E3/uL   Lymphocytes Absolute 1.0 0.7 - 3.1 x10E3/uL   Monocytes Absolute 0.5 0.1 - 0.9 x10E3/uL   EOS (ABSOLUTE) 0.1 0.0 - 0.4 x10E3/uL   Basophils Absolute 0.0 0.0 - 0.2 x10E3/uL   Immature Granulocytes 0 Not Estab. %   Immature Grans (Abs) 0.0 0.0 - 0.1 x10E3/uL  Comprehensive metabolic panel  Result Value Ref Range   Glucose 93 65 - 99 mg/dL   BUN 19 6 - 24 mg/dL   Creatinine, Ser 0.64 0.57 - 1.00 mg/dL   GFR calc non Af Amer 105 >59 mL/min/1.73   GFR calc Af Amer 121 >59 mL/min/1.73   BUN/Creatinine Ratio 30 (H) 9 - 23   Sodium 139 134 - 144 mmol/L   Potassium 4.4 3.5 - 5.2 mmol/L   Chloride 104 96 - 106 mmol/L   CO2 23 20 - 29 mmol/L   Calcium 9.1 8.7 - 10.2 mg/dL   Total Protein 6.0 6.0 - 8.5 g/dL   Albumin 4.1 3.8 - 4.8 g/dL   Globulin, Total 1.9 1.5 - 4.5 g/dL   Albumin/Globulin Ratio 2.2 1.2 - 2.2   Bilirubin Total 0.2 0.0 - 1.2 mg/dL   Alkaline Phosphatase 85 39 - 117 IU/L   AST 12 0  - 40 IU/L   ALT 15 0 - 32 IU/L  H. pylori antibody, IgG([facility])  Result Value Ref Range   H. pylori, IgG AbS 0.18 0.00 - 0.79 Index Value      Assessment & Plan:   Problem List Items Addressed This Visit    None    Visit Diagnoses    Right wrist pain    -  Primary   Suspect tendonitis from overuse. Kenalog IM, flexeril prn, heat/ice, massage, compression brace, rest. Avoid lifting, twisting motions from wrist   Relevant Medications   triamcinolone acetonide (KENALOG-40) injection 40 mg (Completed)       Follow up plan: Return if symptoms worsen or fail to improve.

## 2018-11-28 ENCOUNTER — Ambulatory Visit (INDEPENDENT_AMBULATORY_CARE_PROVIDER_SITE_OTHER): Payer: BC Managed Care – PPO | Admitting: Family Medicine

## 2018-11-28 ENCOUNTER — Encounter: Payer: Self-pay | Admitting: Family Medicine

## 2018-11-28 ENCOUNTER — Other Ambulatory Visit: Payer: Self-pay

## 2018-11-28 VITALS — Temp 98.0°F | Ht 64.0 in | Wt 188.0 lb

## 2018-11-28 DIAGNOSIS — R110 Nausea: Secondary | ICD-10-CM

## 2018-11-28 DIAGNOSIS — R197 Diarrhea, unspecified: Secondary | ICD-10-CM

## 2018-11-28 MED ORDER — AMOXICILLIN-POT CLAVULANATE 875-125 MG PO TABS: 1.0000 | ORAL_TABLET | Freq: Two times a day (BID) | ORAL | 0 refills | Status: DC

## 2018-11-28 MED ORDER — PROMETHAZINE HCL 25 MG PO TABS: 25.0000 mg | ORAL_TABLET | Freq: Three times a day (TID) | ORAL | 0 refills | Status: DC | PRN

## 2018-11-28 NOTE — Progress Notes (Signed)
Temp 98 F (36.7 C) (Oral)   Ht 5\' 4"  (1.626 m)   Wt 188 lb (85.3 kg)   BMI 32.27 kg/m    Subjective:    Patient ID: Julia Jenkins, female    DOB: 1971/04/23, 47 y.o.   MRN: [id number]  HPI: Julia Jenkins is a 48 y.o. female  Chief Complaint  Patient presents with  . Nausea    Patient thinks it's food poisoning. Patient cooked organic chicken. Husband had symptoms too. Ongoing 3 days.  . Diarrhea  . Abdominal Pain    . This visit was completed via WebEx due to the restrictions of the COVID-19 pandemic. All issues as above were discussed and addressed. Physical exam was done as above through visual confirmation on WebEx. If it was felt that the patient should be evaluated in the office, they were directed there. The patient verbally consented to this visit. . Location of the patient: home . Location of the provider: home . Those involved with this call:  . Provider: Merrie Roof, PA-C . CMA: Merilyn Baba, [place] . Front Desk/Registration: Jill Side  . Time spent on call: 15 minutes with patient face to face via video conference. More than 50% of this time was spent in counseling and coordination of care. 5 minutes total spent in review of patient's record and preparation of their chart. I verified patient identity using two factors (patient name and date of birth). Patient consents verbally to being seen via telemedicine visit today.   3 day hx of fever, fatigue, diarrhea, nausea. Unable to keep down any foods, able to tolerate some liquids. Sxs started after eating a chicken that patient states she was concerned about prior to cooking. States she has since spoken with the person who gave it to her as it was farm raised and they endorsed some recent freezer issues where the chicken had been stored. Husband who ate the same meal also sick with same sxs. Taking pepto bismol with minimal relief. Denies cough, congestion, CP, SOB, sick contacts.   Relevant past medical, surgical, family and  social history reviewed and updated as indicated. Interim medical history since our last visit reviewed. Allergies and medications reviewed and updated.  Review of Systems  Per HPI unless specifically indicated above     Objective:    Temp 98 F (36.7 C) (Oral)   Ht 5\' 4"  (1.626 m)   Wt 188 lb (85.3 kg)   BMI 32.27 kg/m   Wt Readings from Last 3 Encounters:  11/28/18 188 lb (85.3 kg)  06/16/18 207 lb (93.9 kg)  05/16/18 203 lb (92.1 kg)    Physical Exam Vitals signs and nursing note reviewed.  Constitutional:      General: She is not in acute distress.    Appearance: Normal appearance.  HENT:     Head: Atraumatic.     Right Ear: External ear normal.     Left Ear: External ear normal.     Nose: Nose normal. No congestion.     Mouth/Throat:     Mouth: Mucous membranes are moist.     Pharynx: Oropharynx is clear. No posterior oropharyngeal erythema.  Eyes:     Extraocular Movements: Extraocular movements intact.     Conjunctiva/sclera: Conjunctivae normal.  Neck:     Musculoskeletal: Normal range of motion.  Cardiovascular:     Comments: Unable to assess via virtual visit Pulmonary:     Effort: Pulmonary effort is normal. No respiratory distress.  Abdominal:  Comments: Diffuse mild ttp per patient's self exam  Musculoskeletal: Normal range of motion.  Skin:    General: Skin is dry.     Findings: No erythema.  Neurological:     Mental Status: She is alert and oriented to person, place, and time.  Psychiatric:        Mood and Affect: Mood normal.        Thought Content: Thought content normal.        Judgment: Judgment normal.     Results for orders placed or performed in visit on 06/16/18  CBC with Differential/Platelet  Result Value Ref Range   WBC 4.0 3.4 - 10.8 x10E3/uL   RBC 4.45 3.77 - 5.28 x10E6/uL   Hemoglobin 12.9 11.1 - 15.9 g/dL   Hematocrit 39.2 34.0 - 46.6 %   MCV 88 79 - 97 fL   MCH 29.0 26.6 - 33.0 pg   MCHC 32.9 31.5 - 35.7 g/dL   RDW  13.3 11.7 - 15.4 %   Platelets 227 150 - 450 x10E3/uL   Neutrophils 58 Not Estab. %   Lymphs 26 Not Estab. %   Monocytes 12 Not Estab. %   Eos 3 Not Estab. %   Basos 1 Not Estab. %   Neutrophils Absolute 2.3 1.4 - 7.0 x10E3/uL   Lymphocytes Absolute 1.0 0.7 - 3.1 x10E3/uL   Monocytes Absolute 0.5 0.1 - 0.9 x10E3/uL   EOS (ABSOLUTE) 0.1 0.0 - 0.4 x10E3/uL   Basophils Absolute 0.0 0.0 - 0.2 x10E3/uL   Immature Granulocytes 0 Not Estab. %   Immature Grans (Abs) 0.0 0.0 - 0.1 x10E3/uL  Comprehensive metabolic panel  Result Value Ref Range   Glucose 93 65 - 99 mg/dL   BUN 19 6 - 24 mg/dL   Creatinine, Ser 0.64 0.57 - 1.00 mg/dL   GFR calc non Af Amer 105 >59 mL/min/1.73   GFR calc Af Amer 121 >59 mL/min/1.73   BUN/Creatinine Ratio 30 (H) 9 - 23   Sodium 139 134 - 144 mmol/L   Potassium 4.4 3.5 - 5.2 mmol/L   Chloride 104 96 - 106 mmol/L   CO2 23 20 - 29 mmol/L   Calcium 9.1 8.7 - 10.2 mg/dL   Total Protein 6.0 6.0 - 8.5 g/dL   Albumin 4.1 3.8 - 4.8 g/dL   Globulin, Total 1.9 1.5 - 4.5 g/dL   Albumin/Globulin Ratio 2.2 1.2 - 2.2   Bilirubin Total 0.2 0.0 - 1.2 mg/dL   Alkaline Phosphatase 85 39 - 117 IU/L   AST 12 0 - 40 IU/L   ALT 15 0 - 32 IU/L  H. pylori antibody, IgG([facility])  Result Value Ref Range   H. pylori, IgG AbS 0.18 0.00 - 0.79 Index Value      Assessment & Plan:   Problem List Items Addressed This Visit    None    Visit Diagnoses    Nausea    -  Primary   Diarrhea, unspecified type        Suspect food-bourne illness from potentially spoiled chicken. Very low suspicion for COVID 19, and patient declines testing. Will start phenergan prn for symptomatic relief, BRAT diet, rest, push fluids. Augmentin sent in case worsening given concern for possible salmonella. Will obtain stool studies if not better over the next week.     Follow up plan: Return if symptoms worsen or fail to improve.

## 2018-11-30 ENCOUNTER — Encounter: Payer: Self-pay | Admitting: Family Medicine

## 2019-02-12 ENCOUNTER — Ambulatory Visit (INDEPENDENT_AMBULATORY_CARE_PROVIDER_SITE_OTHER)
Admission: RE | Admit: 2019-02-12 | Discharge: 2019-02-12 | Disposition: A | Discharge status: Home / Self Care | Payer: Self-pay | Source: Ambulatory Visit

## 2019-02-12 DIAGNOSIS — H938X1 Other specified disorders of right ear: Secondary | ICD-10-CM

## 2019-02-12 DIAGNOSIS — H6981 Other specified disorders of Eustachian tube, right ear: Secondary | ICD-10-CM

## 2019-02-12 MED ORDER — CETIRIZINE HCL 10 MG PO CAPS: 10.0000 mg | ORAL_CAPSULE | Freq: Every day | ORAL | 0 refills | Status: DC

## 2019-02-12 MED ORDER — FLUTICASONE PROPIONATE 50 MCG/ACT NA SUSP: 1.0000 | Freq: Every day | NASAL | 0 refills | Status: DC

## 2019-02-12 NOTE — Discharge Instructions (Signed)
Flonase nasal spray 1-2 sprays in each nostril daily Daily cetirizine  Follow up in person if not improving worseneing, developing pain

## 2019-02-12 NOTE — ED Provider Notes (Signed)
Virtual Visit via Video Note:  Mariena Pala  initiated request for Telemedicine visit with [facility] team. I connected with Jannifer Franklin  on 02/12/2019 at 12:04 PM  for a synchronized telemedicine visit using a video enabled HIPPA compliant telemedicine application. I verified that I am speaking with Jannifer Franklin  using two identifiers. Chandlar Staebell C Nina Mondor, PA-C  was physically located in a [facility] site and Aviyanna Pizzolato was located at a different location.   The limitations of evaluation and management by telemedicine as well as the availability of in-person appointments were discussed. Patient was informed that she  may incur a bill ( including co-pay) for this virtual visit encounter. Deepti Handcock  expressed understanding and gave verbal consent to proceed with virtual visit.     History of Present Illness:Julia Jenkins  is a 48 y.o. female presents for evaluation of right ear fullness.  Patient states that over the past week she has had discomfort within her right ear.  States that it feels as if she has fluid in her ear and often feels the fluid moving around when she changes position.  She has tried draining her ear as well as popping it but has not had success.  She started to have similar sensation in her left ear beginning yesterday.  She denies any pain.  Denies associated congestion cough or sore throat.  Denies fevers chills body aches.  States that she has some slight discomfort behind her thighs.  She has tried naproxen.  Denies recent travel.      No Known Allergies   Past Medical History:  Diagnosis Date  . Hypertension   . Lipoma of neck      Social History   Tobacco Use  . Smoking status: Never Smoker  . Smokeless tobacco: Never Used  Substance Use Topics  . Alcohol use: Not Currently  . Drug use: Never        Observations/Objective: Physical Exam  Constitutional: She is oriented to person, place, and time and well-developed, well-nourished, and in  no distress. No distress.  HENT:  Head: Normocephalic and atraumatic.  Eyes: Right eye exhibits no discharge. Left eye exhibits no discharge.  Neck: Normal range of motion. No thyromegaly present.  Pulmonary/Chest: Effort normal. No respiratory distress.  Speaking in full sentences  Neurological: She is alert and oriented to person, place, and time.  Speech clear, face symmetric     Assessment and Plan:    ICD-10-CM   1. Ear fullness, right  H93.8X1   2. Eustachian tube dysfunction, right  H69.81      Symptoms most consistently lined up with eustachian tube dysfunction.  Will treat with Flonase x1 to 2 weeks.  Also initiate on cetirizine.  Seems less likely cerumen impaction given fluid movement sensation.  Without pain, do not suspect infection.  Advised to follow-up in person if symptoms not persisting or worsening to order to have direct exam of the ears.Discussed strict return precautions. Patient verbalized understanding and is agreeable with plan.   Follow Up Instructions:     I discussed the assessment and treatment plan with the patient. The patient was provided an opportunity to ask questions and all were answered. The patient agreed with the plan and demonstrated an understanding of the instructions.   The patient was advised to call back or seek an in-person evaluation if the symptoms worsen or if the condition fails to improve as anticipated.      Nairi Oswald C Tyliyah Mcmeekin,  PA-C  02/12/2019 12:04 PM         Debara Pickett C, PA-C 02/12/19 1205

## 2019-05-23 DIAGNOSIS — M62431 Contracture of muscle, right forearm: Secondary | ICD-10-CM | POA: Diagnosis not present

## 2019-05-23 DIAGNOSIS — S29012A Strain of muscle and tendon of back wall of thorax, initial encounter: Secondary | ICD-10-CM | POA: Diagnosis not present

## 2019-05-23 DIAGNOSIS — M62830 Muscle spasm of back: Secondary | ICD-10-CM | POA: Diagnosis not present

## 2019-05-23 DIAGNOSIS — M9903 Segmental and somatic dysfunction of lumbar region: Secondary | ICD-10-CM | POA: Diagnosis not present

## 2019-05-23 DIAGNOSIS — M9902 Segmental and somatic dysfunction of thoracic region: Secondary | ICD-10-CM | POA: Diagnosis not present

## 2019-05-23 DIAGNOSIS — M9907 Segmental and somatic dysfunction of upper extremity: Secondary | ICD-10-CM | POA: Diagnosis not present

## 2019-05-24 ENCOUNTER — Other Ambulatory Visit: Payer: Self-pay

## 2019-05-24 ENCOUNTER — Ambulatory Visit: Payer: BC Managed Care – PPO | Admitting: Family Medicine

## 2019-05-24 ENCOUNTER — Encounter: Payer: Self-pay | Admitting: Family Medicine

## 2019-05-24 VITALS — BP 142/86 | HR 73 | Temp 98.4°F

## 2019-05-24 DIAGNOSIS — M62830 Muscle spasm of back: Secondary | ICD-10-CM | POA: Diagnosis not present

## 2019-05-24 DIAGNOSIS — M9907 Segmental and somatic dysfunction of upper extremity: Secondary | ICD-10-CM | POA: Diagnosis not present

## 2019-05-24 DIAGNOSIS — M4724 Other spondylosis with radiculopathy, thoracic region: Secondary | ICD-10-CM

## 2019-05-24 DIAGNOSIS — M9903 Segmental and somatic dysfunction of lumbar region: Secondary | ICD-10-CM | POA: Diagnosis not present

## 2019-05-24 DIAGNOSIS — S29012A Strain of muscle and tendon of back wall of thorax, initial encounter: Secondary | ICD-10-CM | POA: Diagnosis not present

## 2019-05-24 DIAGNOSIS — M9902 Segmental and somatic dysfunction of thoracic region: Secondary | ICD-10-CM | POA: Diagnosis not present

## 2019-05-24 DIAGNOSIS — M62431 Contracture of muscle, right forearm: Secondary | ICD-10-CM | POA: Diagnosis not present

## 2019-05-24 DIAGNOSIS — R030 Elevated blood-pressure reading, without diagnosis of hypertension: Secondary | ICD-10-CM

## 2019-05-24 MED ORDER — PREDNISONE 10 MG PO TABS: ORAL_TABLET | ORAL | 0 refills | Status: DC

## 2019-05-24 MED ORDER — TRIAMCINOLONE ACETONIDE 40 MG/ML IJ SUSP
40.0000 mg | Freq: Once | INTRAMUSCULAR | Status: AC
  Administered 2019-05-24: 11:00:00 40 mg via INTRAMUSCULAR

## 2019-05-24 MED ORDER — TRAZODONE HCL 50 MG PO TABS: 25.0000 mg | ORAL_TABLET | Freq: Every evening | ORAL | 0 refills | Status: DC | PRN

## 2019-05-24 NOTE — Patient Instructions (Signed)
DASH Eating Plan DASH stands for "Dietary Approaches to Stop Hypertension." The DASH eating plan is a healthy eating plan that has been shown to reduce high blood pressure (hypertension). It may also reduce your risk for type 2 diabetes, heart disease, and stroke. The DASH eating plan may also help with weight loss. What are tips for following this plan?  General guidelines  Avoid eating more than 2,300 mg (milligrams) of salt (sodium) a day. If you have hypertension, you may need to reduce your sodium intake to 1,500 mg a day.  Limit alcohol intake to no more than 1 drink a day for nonpregnant women and 2 drinks a day for men. One drink equals 12 oz of beer, 5 oz of wine, or 1 oz of hard liquor.  Work with your health care provider to maintain a healthy body weight or to lose weight. Ask what an ideal weight is for you.  Get at least 30 minutes of exercise that causes your heart to beat faster (aerobic exercise) most days of the week. Activities may include walking, swimming, or biking.  Work with your health care provider or diet and nutrition specialist (dietitian) to adjust your eating plan to your individual calorie needs. Reading food labels   Check food labels for the amount of sodium per serving. Choose foods with less than 5 percent of the Daily Value of sodium. Generally, foods with less than 300 mg of sodium per serving fit into this eating plan.  To find whole grains, look for the word "whole" as the first word in the ingredient list. Shopping  Buy products labeled as "low-sodium" or "no salt added."  Buy fresh foods. Avoid canned foods and premade or frozen meals. Cooking  Avoid adding salt when cooking. Use salt-free seasonings or herbs instead of table salt or sea salt. Check with your health care provider or pharmacist before using salt substitutes.  Do not fry foods. Cook foods using healthy methods such as baking, boiling, grilling, and broiling instead.  Cook with  heart-healthy oils, such as olive, canola, soybean, or sunflower oil. Meal planning  Eat a balanced diet that includes: ? 5 or more servings of fruits and vegetables each day. At each meal, try to fill half of your plate with fruits and vegetables. ? Up to 6-8 servings of whole grains each day. ? Less than 6 oz of lean meat, poultry, or fish each day. A 3-oz serving of meat is about the same size as a deck of cards. One egg equals 1 oz. ? 2 servings of low-fat dairy each day. ? A serving of nuts, seeds, or beans 5 times each week. ? Heart-healthy fats. Healthy fats called Omega-3 fatty acids are found in foods such as flaxseeds and coldwater fish, like sardines, salmon, and mackerel.  Limit how much you eat of the following: ? Canned or prepackaged foods. ? Food that is high in trans fat, such as fried foods. ? Food that is high in saturated fat, such as fatty meat. ? Sweets, desserts, sugary drinks, and other foods with added sugar. ? Full-fat dairy products.  Do not salt foods before eating.  Try to eat at least 2 vegetarian meals each week.  Eat more home-cooked food and less restaurant, buffet, and fast food.  When eating at a restaurant, ask that your food be prepared with less salt or no salt, if possible. What foods are recommended? The items listed may not be a complete list. Talk with your dietitian about   what dietary choices are best for you. Grains Whole-grain or whole-wheat bread. Whole-grain or whole-wheat pasta. Brown rice. Oatmeal. Quinoa. Bulgur. Whole-grain and low-sodium cereals. Pita bread. Low-fat, low-sodium crackers. Whole-wheat flour tortillas. Vegetables Fresh or frozen vegetables (raw, steamed, roasted, or grilled). Low-sodium or reduced-sodium tomato and vegetable juice. Low-sodium or reduced-sodium tomato sauce and tomato paste. Low-sodium or reduced-sodium canned vegetables. Fruits All fresh, dried, or frozen fruit. Canned fruit in natural juice (without  added sugar). Meat and other protein foods Skinless chicken or turkey. Ground chicken or turkey. Pork with fat trimmed off. Fish and seafood. Egg whites. Dried beans, peas, or lentils. Unsalted nuts, nut butters, and seeds. Unsalted canned beans. Lean cuts of beef with fat trimmed off. Low-sodium, lean deli meat. Dairy Low-fat (1%) or fat-free (skim) milk. Fat-free, low-fat, or reduced-fat cheeses. Nonfat, low-sodium ricotta or cottage cheese. Low-fat or nonfat yogurt. Low-fat, low-sodium cheese. Fats and oils Soft margarine without trans fats. Vegetable oil. Low-fat, reduced-fat, or light mayonnaise and salad dressings (reduced-sodium). Canola, safflower, olive, soybean, and sunflower oils. Avocado. Seasoning and other foods Herbs. Spices. Seasoning mixes without salt. Unsalted popcorn and pretzels. Fat-free sweets. What foods are not recommended? The items listed may not be a complete list. Talk with your dietitian about what dietary choices are best for you. Grains Baked goods made with fat, such as croissants, muffins, or some breads. Dry pasta or rice meal packs. Vegetables Creamed or fried vegetables. Vegetables in a cheese sauce. Regular canned vegetables (not low-sodium or reduced-sodium). Regular canned tomato sauce and paste (not low-sodium or reduced-sodium). Regular tomato and vegetable juice (not low-sodium or reduced-sodium). Pickles. Olives. Fruits Canned fruit in a light or heavy syrup. Fried fruit. Fruit in cream or butter sauce. Meat and other protein foods Fatty cuts of meat. Ribs. Fried meat. Bacon. Sausage. Bologna and other processed lunch meats. Salami. Fatback. Hotdogs. Bratwurst. Salted nuts and seeds. Canned beans with added salt. Canned or smoked fish. Whole eggs or egg yolks. Chicken or turkey with skin. Dairy Whole or 2% milk, cream, and half-and-half. Whole or full-fat cream cheese. Whole-fat or sweetened yogurt. Full-fat cheese. Nondairy creamers. Whipped toppings.  Processed cheese and cheese spreads. Fats and oils Butter. Stick margarine. Lard. Shortening. Ghee. Bacon fat. Tropical oils, such as coconut, palm kernel, or palm oil. Seasoning and other foods Salted popcorn and pretzels. Onion salt, garlic salt, seasoned salt, table salt, and sea salt. Worcestershire sauce. Tartar sauce. Barbecue sauce. Teriyaki sauce. Soy sauce, including reduced-sodium. Steak sauce. Canned and packaged gravies. Fish sauce. Oyster sauce. Cocktail sauce. Horseradish that you find on the shelf. Ketchup. Mustard. Meat flavorings and tenderizers. Bouillon cubes. Hot sauce and Tabasco sauce. Premade or packaged marinades. Premade or packaged taco seasonings. Relishes. Regular salad dressings. Where to find more information:  National Heart, Lung, and Blood Institute: www.nhlbi.nih.gov  American Heart Association: www.heart.org Summary  The DASH eating plan is a healthy eating plan that has been shown to reduce high blood pressure (hypertension). It may also reduce your risk for type 2 diabetes, heart disease, and stroke.  With the DASH eating plan, you should limit salt (sodium) intake to 2,300 mg a day. If you have hypertension, you may need to reduce your sodium intake to 1,500 mg a day.  When on the DASH eating plan, aim to eat more fresh fruits and vegetables, whole grains, lean proteins, low-fat dairy, and heart-healthy fats.  Work with your health care provider or diet and nutrition specialist (dietitian) to adjust your eating plan to your   individual calorie needs. This information is not intended to replace advice given to you by your health care provider. Make sure you discuss any questions you have with your health care provider. Document Revised: 04/01/2017 Document Reviewed: 04/12/2016 Elsevier Patient Education  2020 [employer]  

## 2019-05-24 NOTE — Progress Notes (Signed)
BP (!) 142/86   Pulse 73   Temp 98.4 F (36.9 C)   SpO2 97%    Subjective:    Patient ID: Julia Jenkins, female    DOB: 1971-02-13, 49 y.o.   MRN: [id number]  HPI: Julia Jenkins is a 49 y.o. female  Chief Complaint  Patient presents with  . Hypertension  . Back Pain    Patient states that the pain is on the left side beside her spine, this is cauing her pain all the time and waking her up during the night.    ELEVATED BLOOD PRESSURE Duration of elevated BP: Couple of months BP monitoring frequency: a few times a week BP range: [id number] systolic Previous BP meds: yes- lisinopril, unsure dose, no side effects Recent stressors: no Family history of hypertension: yes Recurrent headaches: no Visual changes: no Palpitations: no  Dyspnea: no Chest pain: no Lower extremity edema: no Dizzy/lightheaded: no Transient ischemic attacks: no  BACK PAIN Duration: couple of months, 3-4 days where she couldn't sleep Mechanism of injury: lifting Location: Left and upper back Onset: sudden Severity: severe Quality: sharp Frequency: constant Radiation: none Aggravating factors: rest and sleep Alleviating factors: moving around Status: better Treatments attempted: ibuporfen, heat, chiropractor, deep tissue massage   Relief with NSAIDs?: no Nighttime pain:  yes Paresthesias / decreased sensation:  no Bowel / bladder incontinence:  no Fevers:  no Dysuria / urinary frequency:  no  Relevant past medical, surgical, family and social history reviewed and updated as indicated. Interim medical history since our last visit reviewed. Allergies and medications reviewed and updated.  Review of Systems  Constitutional: Negative.   Respiratory: Negative.   Cardiovascular: Negative.   Gastrointestinal: Negative.   Genitourinary: Negative.   Musculoskeletal: Positive for back pain and myalgias. Negative for arthralgias, gait problem, joint swelling, neck pain and neck stiffness.  Skin: Negative.     Neurological: Negative.   Psychiatric/Behavioral: Positive for sleep disturbance. Negative for agitation, behavioral problems, confusion, decreased concentration, dysphoric mood, hallucinations, self-injury and suicidal ideas. The patient is nervous/anxious. The patient is not hyperactive.     Per HPI unless specifically indicated above     Objective:    BP (!) 142/86   Pulse 73   Temp 98.4 F (36.9 C)   SpO2 97%   Wt Readings from Last 3 Encounters:  11/28/18 188 lb (85.3 kg)  06/16/18 207 lb (93.9 kg)  05/16/18 203 lb (92.1 kg)    Physical Exam Vitals and nursing note reviewed.  Constitutional:      General: She is not in acute distress.    Appearance: Normal appearance. She is not ill-appearing, toxic-appearing or diaphoretic.  HENT:     Head: Normocephalic and atraumatic.     Right Ear: External ear normal.     Left Ear: External ear normal.     Nose: Nose normal.     Mouth/Throat:     Mouth: Mucous membranes are moist.     Pharynx: Oropharynx is clear.  Eyes:     General: No scleral icterus.       Right eye: No discharge.        Left eye: No discharge.     Extraocular Movements: Extraocular movements intact.     Conjunctiva/sclera: Conjunctivae normal.     Pupils: Pupils are equal, round, and reactive to light.  Cardiovascular:     Rate and Rhythm: Normal rate and regular rhythm.     Pulses: Normal pulses.     Heart sounds:  Normal heart sounds. No murmur. No friction rub. No gallop.   Pulmonary:     Effort: Pulmonary effort is normal. No respiratory distress.     Breath sounds: Normal breath sounds. No stridor. No wheezing, rhonchi or rales.  Chest:     Chest wall: No tenderness.  Musculoskeletal:        General: Normal range of motion.     Cervical back: Normal range of motion and neck supple.     Comments: Tenderness T10 just L of midline  Skin:    General: Skin is warm and dry.     Capillary Refill: Capillary refill takes less than 2 seconds.      Coloration: Skin is not jaundiced or pale.     Findings: No bruising, erythema, lesion or rash.  Neurological:     General: No focal deficit present.     Mental Status: She is alert and oriented to person, place, and time. Mental status is at baseline.     Cranial Nerves: No cranial nerve deficit.     Sensory: No sensory deficit.     Motor: No weakness.     Coordination: Coordination normal.     Gait: Gait normal.     Deep Tendon Reflexes: Reflexes normal.  Psychiatric:        Mood and Affect: Mood normal.        Behavior: Behavior normal.        Thought Content: Thought content normal.        Judgment: Judgment normal.     Results for orders placed or performed in visit on 06/16/18  CBC with Differential/Platelet  Result Value Ref Range   WBC 4.0 3.4 - 10.8 x10E3/uL   RBC 4.45 3.77 - 5.28 x10E6/uL   Hemoglobin 12.9 11.1 - 15.9 g/dL   Hematocrit 39.2 34.0 - 46.6 %   MCV 88 79 - 97 fL   MCH 29.0 26.6 - 33.0 pg   MCHC 32.9 31.5 - 35.7 g/dL   RDW 13.3 11.7 - 15.4 %   Platelets 227 150 - 450 x10E3/uL   Neutrophils 58 Not Estab. %   Lymphs 26 Not Estab. %   Monocytes 12 Not Estab. %   Eos 3 Not Estab. %   Basos 1 Not Estab. %   Neutrophils Absolute 2.3 1.4 - 7.0 x10E3/uL   Lymphocytes Absolute 1.0 0.7 - 3.1 x10E3/uL   Monocytes Absolute 0.5 0.1 - 0.9 x10E3/uL   EOS (ABSOLUTE) 0.1 0.0 - 0.4 x10E3/uL   Basophils Absolute 0.0 0.0 - 0.2 x10E3/uL   Immature Granulocytes 0 Not Estab. %   Immature Grans (Abs) 0.0 0.0 - 0.1 x10E3/uL  Comprehensive metabolic panel  Result Value Ref Range   Glucose 93 65 - 99 mg/dL   BUN 19 6 - 24 mg/dL   Creatinine, Ser 0.64 0.57 - 1.00 mg/dL   GFR calc non Af Amer 105 >59 mL/min/1.73   GFR calc Af Amer 121 >59 mL/min/1.73   BUN/Creatinine Ratio 30 (H) 9 - 23   Sodium 139 134 - 144 mmol/L   Potassium 4.4 3.5 - 5.2 mmol/L   Chloride 104 96 - 106 mmol/L   CO2 23 20 - 29 mmol/L   Calcium 9.1 8.7 - 10.2 mg/dL   Total Protein 6.0 6.0 - 8.5 g/dL     Albumin 4.1 3.8 - 4.8 g/dL   Globulin, Total 1.9 1.5 - 4.5 g/dL   Albumin/Globulin Ratio 2.2 1.2 - 2.2   Bilirubin Total 0.2 0.0 -  1.2 mg/dL   Alkaline Phosphatase 85 39 - 117 IU/L   AST 12 0 - 40 IU/L   ALT 15 0 - 32 IU/L  H. pylori antibody, IgG([facility])  Result Value Ref Range   H. pylori, IgG AbS 0.18 0.00 - 0.79 Index Value      Assessment & Plan:   Problem List Items Addressed This Visit      Nervous and Auditory   Osteoarthritis of spine with radiculopathy, thoracic region - Primary    Now with significant pain. Kenalog shot today, prednisone starting tomorrow. Continue chiropractry and get started with PT. Call if worsening and recheck 2 weeks.       Relevant Medications   predniSONE (DELTASONE) 10 MG tablet   traZODone (DESYREL) 50 MG tablet   Other Relevant Orders   Ambulatory referral to Physical Therapy    Other Visit Diagnoses    Elevated BP without diagnosis of hypertension       Likely due to pain and lack of sleep- will treat pain and work on [place]- recheck in 2 weeks. Call with any concerns.        Follow up plan: Return in about 2 weeks (around 06/07/2019).

## 2019-05-24 NOTE — Assessment & Plan Note (Signed)
Now with significant pain. Kenalog shot today, prednisone starting tomorrow. Continue chiropractry and get started with PT. Call if worsening and recheck 2 weeks.

## 2019-05-28 DIAGNOSIS — M9902 Segmental and somatic dysfunction of thoracic region: Secondary | ICD-10-CM | POA: Diagnosis not present

## 2019-05-28 DIAGNOSIS — M62830 Muscle spasm of back: Secondary | ICD-10-CM | POA: Diagnosis not present

## 2019-05-28 DIAGNOSIS — S29012A Strain of muscle and tendon of back wall of thorax, initial encounter: Secondary | ICD-10-CM | POA: Diagnosis not present

## 2019-05-28 DIAGNOSIS — M9903 Segmental and somatic dysfunction of lumbar region: Secondary | ICD-10-CM | POA: Diagnosis not present

## 2019-05-28 DIAGNOSIS — M9907 Segmental and somatic dysfunction of upper extremity: Secondary | ICD-10-CM | POA: Diagnosis not present

## 2019-05-28 DIAGNOSIS — M62431 Contracture of muscle, right forearm: Secondary | ICD-10-CM | POA: Diagnosis not present

## 2019-05-30 ENCOUNTER — Telehealth: Payer: Self-pay | Admitting: Family Medicine

## 2019-05-30 NOTE — Telephone Encounter (Signed)
Copied from [place] [id number]. Topic: General - Inquiry >> May 30, 2019  4:26 PM Alease Frame wrote: Reason for CRM: Georgeann Oppenheim from [facility] called to receive a call back from Dr Wynetta Emery. She wanted to say thank you for the referral   Call back [phone]

## 2019-05-30 NOTE — Telephone Encounter (Signed)
Noted. Do I need to call them back?

## 2019-06-04 DIAGNOSIS — M9907 Segmental and somatic dysfunction of upper extremity: Secondary | ICD-10-CM | POA: Diagnosis not present

## 2019-06-04 DIAGNOSIS — M62431 Contracture of muscle, right forearm: Secondary | ICD-10-CM | POA: Diagnosis not present

## 2019-06-04 DIAGNOSIS — S29012A Strain of muscle and tendon of back wall of thorax, initial encounter: Secondary | ICD-10-CM | POA: Diagnosis not present

## 2019-06-04 DIAGNOSIS — M9902 Segmental and somatic dysfunction of thoracic region: Secondary | ICD-10-CM | POA: Diagnosis not present

## 2019-06-04 DIAGNOSIS — M9903 Segmental and somatic dysfunction of lumbar region: Secondary | ICD-10-CM | POA: Diagnosis not present

## 2019-06-04 DIAGNOSIS — M62830 Muscle spasm of back: Secondary | ICD-10-CM | POA: Diagnosis not present

## 2019-06-13 DIAGNOSIS — M62830 Muscle spasm of back: Secondary | ICD-10-CM | POA: Diagnosis not present

## 2019-06-13 DIAGNOSIS — M9907 Segmental and somatic dysfunction of upper extremity: Secondary | ICD-10-CM | POA: Diagnosis not present

## 2019-06-13 DIAGNOSIS — S29012A Strain of muscle and tendon of back wall of thorax, initial encounter: Secondary | ICD-10-CM | POA: Diagnosis not present

## 2019-06-13 DIAGNOSIS — M9902 Segmental and somatic dysfunction of thoracic region: Secondary | ICD-10-CM | POA: Diagnosis not present

## 2019-06-13 DIAGNOSIS — M62431 Contracture of muscle, right forearm: Secondary | ICD-10-CM | POA: Diagnosis not present

## 2019-06-13 DIAGNOSIS — M9903 Segmental and somatic dysfunction of lumbar region: Secondary | ICD-10-CM | POA: Diagnosis not present

## 2019-06-16 ENCOUNTER — Other Ambulatory Visit: Payer: Self-pay | Admitting: Family Medicine

## 2019-07-16 ENCOUNTER — Encounter: Payer: Self-pay | Admitting: Family Medicine

## 2019-07-16 ENCOUNTER — Telehealth (INDEPENDENT_AMBULATORY_CARE_PROVIDER_SITE_OTHER): Payer: BC Managed Care – PPO | Admitting: Family Medicine

## 2019-07-16 VITALS — BP 159/96 | HR 76

## 2019-07-16 DIAGNOSIS — R0683 Snoring: Secondary | ICD-10-CM | POA: Diagnosis not present

## 2019-07-16 DIAGNOSIS — I10 Essential (primary) hypertension: Secondary | ICD-10-CM | POA: Diagnosis not present

## 2019-07-16 DIAGNOSIS — Z780 Asymptomatic menopausal state: Secondary | ICD-10-CM | POA: Diagnosis not present

## 2019-07-16 MED ORDER — LISINOPRIL 10 MG PO TABS: 10.0000 mg | ORAL_TABLET | Freq: Every day | ORAL | 3 refills | Status: DC

## 2019-07-16 MED ORDER — DULOXETINE HCL 20 MG PO CPEP: 20.0000 mg | ORAL_CAPSULE | Freq: Every day | ORAL | 3 refills | Status: DC

## 2019-07-16 NOTE — Progress Notes (Signed)
BP (!) 159/96   Pulse 76    Subjective:    Patient ID: Julia Jenkins, female    DOB: [birth year], 49 y.o.   MRN: [id number]  HPI: Julia Jenkins is a 49 y.o. female  Chief Complaint  Patient presents with  . Hypertension    pt states she has been having elevated BP readings, very sleepy and pressure in her head  . Menopause    pt states she has been having trouble remembering and feeling "foggy brained"    ELEVATED BLOOD PRESSURE Duration of elevated BP: about 2 months BP monitoring frequency: a few times a week BP range: 150s/90s Previous BP meds: no Recent stressors: yes Family history of hypertension: yes Recurrent headaches: yes Visual changes: no Palpitations: no  Dyspnea: no Chest pain: no Lower extremity edema: no Dizzy/lightheaded: no Transient ischemic attacks: no  Having trouble focusing. She has been feeding 1800 meals a week. She notes that her focus is not doing well. She's having trouble remembering words. She notes that she has been feeling very tired. She notes that she is falling asleep when she's sitting there. She has been sleeping pretty well at night.   ???SLEEP APNEA- fitbit is showing that she's restless  Sleep apnea status: unknown- no diagnosis Duration: chronic Wakes feeling refreshed:  yes Daytime hypersomnolence:  no Fatigue:  yes Insomnia:  yes Good sleep hygiene:  yes Difficulty falling asleep:  no Difficulty staying asleep:  yes Snoring bothers bed partner:  yes Observed apnea by bed partner: yes Obesity:  yes Hypertension: yes  Pulmonary hypertension:  no Coronary artery disease:  no  Relevant past medical, surgical, family and social history reviewed and updated as indicated. Interim medical history since our last visit reviewed. Allergies and medications reviewed and updated.  Review of Systems  Constitutional: Negative.   HENT: Negative.   Respiratory: Negative.   Cardiovascular: Negative.   Musculoskeletal: Negative.   Skin:  Negative.   Neurological: Negative.   Psychiatric/Behavioral: Positive for confusion and sleep disturbance. Negative for agitation, behavioral problems, decreased concentration, dysphoric mood, hallucinations, self-injury and suicidal ideas. The patient is nervous/anxious. The patient is not hyperactive.     Per HPI unless specifically indicated above     Objective:    BP (!) 159/96   Pulse 76   Wt Readings from Last 3 Encounters:  11/28/18 188 lb (85.3 kg)  06/16/18 207 lb (93.9 kg)  05/16/18 203 lb (92.1 kg)    Physical Exam Vitals and nursing note reviewed.  Constitutional:      General: She is not in acute distress.    Appearance: Normal appearance. She is not ill-appearing, toxic-appearing or diaphoretic.  HENT:     Head: Normocephalic and atraumatic.     Right Ear: External ear normal.     Left Ear: External ear normal.     Nose: Nose normal.     Mouth/Throat:     Mouth: Mucous membranes are moist.     Pharynx: Oropharynx is clear.  Eyes:     General: No scleral icterus.       Right eye: No discharge.        Left eye: No discharge.     Conjunctiva/sclera: Conjunctivae normal.     Pupils: Pupils are equal, round, and reactive to light.  Pulmonary:     Effort: Pulmonary effort is normal. No respiratory distress.     Comments: Speaking in full sentences Musculoskeletal:        General: Normal range of  motion.     Cervical back: Normal range of motion.  Skin:    Coloration: Skin is not jaundiced or pale.     Findings: No bruising, erythema, lesion or rash.  Neurological:     Mental Status: She is alert and oriented to person, place, and time. Mental status is at baseline.  Psychiatric:        Mood and Affect: Mood normal.        Behavior: Behavior normal.        Thought Content: Thought content normal.        Judgment: Judgment normal.     Results for orders placed or performed in visit on 06/16/18  CBC with Differential/Platelet  Result Value Ref Range    WBC 4.0 3.4 - 10.8 x10E3/uL   RBC 4.45 3.77 - 5.28 x10E6/uL   Hemoglobin 12.9 11.1 - 15.9 g/dL   Hematocrit 39.2 34.0 - 46.6 %   MCV 88 79 - 97 fL   MCH 29.0 26.6 - 33.0 pg   MCHC 32.9 31.5 - 35.7 g/dL   RDW 13.3 11.7 - 15.4 %   Platelets 227 150 - 450 x10E3/uL   Neutrophils 58 Not Estab. %   Lymphs 26 Not Estab. %   Monocytes 12 Not Estab. %   Eos 3 Not Estab. %   Basos 1 Not Estab. %   Neutrophils Absolute 2.3 1.4 - 7.0 x10E3/uL   Lymphocytes Absolute 1.0 0.7 - 3.1 x10E3/uL   Monocytes Absolute 0.5 0.1 - 0.9 x10E3/uL   EOS (ABSOLUTE) 0.1 0.0 - 0.4 x10E3/uL   Basophils Absolute 0.0 0.0 - 0.2 x10E3/uL   Immature Granulocytes 0 Not Estab. %   Immature Grans (Abs) 0.0 0.0 - 0.1 x10E3/uL  Comprehensive metabolic panel  Result Value Ref Range   Glucose 93 65 - 99 mg/dL   BUN 19 6 - 24 mg/dL   Creatinine, Ser 0.64 0.57 - 1.00 mg/dL   GFR calc non Af Amer 105 >59 mL/min/1.73   GFR calc Af Amer 121 >59 mL/min/1.73   BUN/Creatinine Ratio 30 (H) 9 - 23   Sodium 139 134 - 144 mmol/L   Potassium 4.4 3.5 - 5.2 mmol/L   Chloride 104 96 - 106 mmol/L   CO2 23 20 - 29 mmol/L   Calcium 9.1 8.7 - 10.2 mg/dL   Total Protein 6.0 6.0 - 8.5 g/dL   Albumin 4.1 3.8 - 4.8 g/dL   Globulin, Total 1.9 1.5 - 4.5 g/dL   Albumin/Globulin Ratio 2.2 1.2 - 2.2   Bilirubin Total 0.2 0.0 - 1.2 mg/dL   Alkaline Phosphatase 85 39 - 117 IU/L   AST 12 0 - 40 IU/L   ALT 15 0 - 32 IU/L  H. pylori antibody, IgG([facility])  Result Value Ref Range   H. pylori, IgG AbS 0.18 0.00 - 0.79 Index Value      Assessment & Plan:   Problem List Items Addressed This Visit      Cardiovascular and Mediastinum   Essential hypertension    Will start her on lisinopril and recheck 1 month. Call with any concerns.       Relevant Medications   lisinopril (ZESTRIL) 10 MG tablet     Other   Menopause    Having brain fog and night sweats. Will start cymbalta. Recheck 1 month. Call with any concerns.         Other Visit Diagnoses    Snoring    -  Primary   Will get  her sleep study- await results. Call with any concerns.    Relevant Orders   Ambulatory referral to Sleep Studies       Follow up plan: Return in about 4 weeks (around 08/13/2019).    . This visit was completed via MyChart due to the restrictions of the COVID-19 pandemic. All issues as above were discussed and addressed. Physical exam was done as above through visual confirmation on MyChart. If it was felt that the patient should be evaluated in the office, they were directed there. The patient verbally consented to this visit. . Location of the patient: parking lot . Location of the provider: work . Those involved with this call:  . Provider: Park Liter, DO . CMA: Tiffany Reel, CMA . Front Desk/Registration: Don Perking  . Time spent on call: 25 minutes with patient face to face via video conference. More than 50% of this time was spent in counseling and coordination of care. 40 minutes total spent in review of patient's record and preparation of their chart.

## 2019-07-16 NOTE — Assessment & Plan Note (Signed)
Having brain fog and night sweats. Will start cymbalta. Recheck 1 month. Call with any concerns.

## 2019-07-16 NOTE — Assessment & Plan Note (Signed)
Will start her on lisinopril and recheck 1 month. Call with any concerns.

## 2019-07-19 ENCOUNTER — Telehealth: Payer: Self-pay

## 2019-07-19 NOTE — Telephone Encounter (Signed)
Did PA on medication 07/16/2019. CoverMyMeds Key: [id number] Status: Drug is covered by current benefit plan. No further PA activity needed  Spoke with pharmacy, medication is still being denied due to age limit. Will call insurance company to try to get medication to go through.  Tried calling patient to update her. No answer. LVM that I will try her again tomorrow.

## 2019-07-19 NOTE — Telephone Encounter (Signed)
Copied from [place] [phone]. Topic: General - Other >> Jul 19, 2019  4:02 PM Rainey Pines A wrote: Patient would like a callback from nurse with status update on prior authorization for insurance for her Cymbalta medication. Please advise

## 2019-07-30 ENCOUNTER — Ambulatory Visit: Payer: Self-pay | Admitting: *Deleted

## 2019-07-30 NOTE — Telephone Encounter (Signed)
Summary: requesting cb from RN, med side effects, weakness    Patient requesting a call back from RN regarding potential side effects of DULoxetine (CYMBALTA) 20 MG capsule she states she is having some weakness, headache, foggy feeling, no appetite.       Reason for Disposition . [1] Caller has NON-URGENT medication question about med that PCP prescribed AND [2] triager unable to answer question  Answer Assessment - Initial Assessment Questions 1.   NAME of MEDICATION: "What medicine are you calling about?"     Cymbalta 2.   QUESTION: "What is your question?"     Possible side effects 3.   PRESCRIBING HCP: "Who prescribed it?" Reason: if prescribed by specialist, call should be referred to that group.     PCP 4. SYMPTOMS: "Do you have any symptoms?"     Started medication on Monday last week- by Thursday afternoon patient states she started feeling really weird- dizzy lightheaded, foggy, nausea, shakes 5. SEVERITY: If symptoms are present, ask "Are they mild, moderate or severe?"     severe Patient stopped and did not take yesterday- today she is feeling a little clearer today  Protocols used: MEDICATION QUESTION CALL-A-AH

## 2019-08-03 NOTE — Telephone Encounter (Signed)
Appt if still feeling this way stop med

## 2019-08-03 NOTE — Telephone Encounter (Signed)
Spoke with Julia Jenkins. Stated she has stopped Cymbalta at this time. Has appt 08/13/2019 at 11 am.

## 2019-08-13 ENCOUNTER — Telehealth: Payer: BC Managed Care – PPO | Admitting: Family Medicine

## 2019-08-20 DIAGNOSIS — R0681 Apnea, not elsewhere classified: Secondary | ICD-10-CM | POA: Diagnosis not present

## 2019-08-20 DIAGNOSIS — G478 Other sleep disorders: Secondary | ICD-10-CM | POA: Diagnosis not present

## 2019-08-20 DIAGNOSIS — R0683 Snoring: Secondary | ICD-10-CM | POA: Diagnosis not present

## 2019-09-07 ENCOUNTER — Encounter: Payer: Self-pay | Admitting: Family Medicine

## 2019-09-10 DIAGNOSIS — G4733 Obstructive sleep apnea (adult) (pediatric): Secondary | ICD-10-CM | POA: Diagnosis not present

## 2019-09-10 DIAGNOSIS — J302 Other seasonal allergic rhinitis: Secondary | ICD-10-CM | POA: Diagnosis not present

## 2019-09-10 DIAGNOSIS — R0683 Snoring: Secondary | ICD-10-CM | POA: Diagnosis not present

## 2019-09-14 ENCOUNTER — Encounter: Payer: Self-pay | Admitting: Family Medicine

## 2019-09-14 ENCOUNTER — Ambulatory Visit (INDEPENDENT_AMBULATORY_CARE_PROVIDER_SITE_OTHER): Payer: BC Managed Care – PPO | Admitting: Family Medicine

## 2019-09-14 ENCOUNTER — Other Ambulatory Visit: Payer: Self-pay

## 2019-09-14 VITALS — BP 139/80 | HR 70 | Temp 98.1°F | Ht 63.78 in | Wt 199.4 lb

## 2019-09-14 DIAGNOSIS — Z780 Asymptomatic menopausal state: Secondary | ICD-10-CM | POA: Diagnosis not present

## 2019-09-14 DIAGNOSIS — R0602 Shortness of breath: Secondary | ICD-10-CM

## 2019-09-14 DIAGNOSIS — F419 Anxiety disorder, unspecified: Secondary | ICD-10-CM | POA: Diagnosis not present

## 2019-09-14 DIAGNOSIS — I10 Essential (primary) hypertension: Secondary | ICD-10-CM

## 2019-09-14 DIAGNOSIS — D229 Melanocytic nevi, unspecified: Secondary | ICD-10-CM

## 2019-09-14 DIAGNOSIS — Z136 Encounter for screening for cardiovascular disorders: Secondary | ICD-10-CM | POA: Diagnosis not present

## 2019-09-14 LAB — UA/M W/RFLX CULTURE, ROUTINE
Bilirubin, UA: NEGATIVE
Glucose, UA: NEGATIVE
Leukocytes,UA: NEGATIVE
Nitrite, UA: NEGATIVE
Protein,UA: NEGATIVE
RBC, UA: NEGATIVE
Specific Gravity, UA: 1.01 (ref 1.005–1.030)
Urobilinogen, Ur: 1 mg/dL (ref 0.2–1.0)
pH, UA: 5.5 (ref 5.0–7.5)

## 2019-09-14 MED ORDER — ESCITALOPRAM OXALATE 5 MG PO TABS: 2.5000 mg | ORAL_TABLET | Freq: Every day | ORAL | 2 refills | Status: DC

## 2019-09-14 NOTE — Patient Instructions (Addendum)
Black Cohash Evening Primrose Oil Estroven   Perimenopause  Perimenopause is the normal time of life before and after menstrual periods stop completely (menopause). Perimenopause can begin 2-8 years before menopause, and it usually lasts for 1 year after menopause. During perimenopause, the ovaries may or may not produce an egg. What are the causes? This condition is caused by a natural change in hormone levels that happens as you get older. What increases the risk? This condition is more likely to start at an earlier age if you have certain medical conditions or treatments, including:  A tumor of the pituitary gland in the brain.  A disease that affects the ovaries and hormone production.  Radiation treatment for cancer.  Certain cancer treatments, such as chemotherapy or hormone (anti-estrogen) therapy.  Heavy smoking and excessive alcohol use.  Family history of early menopause. What are the signs or symptoms? Perimenopausal changes affect each woman differently. Symptoms of this condition may include:  Hot flashes.  Night sweats.  Irregular menstrual periods.  Decreased sex drive.  Vaginal dryness.  Headaches.  Mood swings.  Depression.  Memory problems or trouble concentrating.  Irritability.  Tiredness.  Weight gain.  Anxiety.  Trouble getting pregnant. How is this diagnosed? This condition is diagnosed based on your medical history, a physical exam, your age, your menstrual history, and your symptoms. Hormone tests may also be done. How is this treated? In some cases, no treatment is needed. You and your health care provider should make a decision together about whether treatment is necessary. Treatment will be based on your individual condition and preferences. Various treatments are available, such as:  Menopausal hormone therapy (MHT).  Medicines to treat specific symptoms.  Acupuncture.  Vitamin or herbal supplements. Before starting  treatment, make sure to let your health care provider know if you have a personal or family history of:  Heart disease.  Breast cancer.  Blood clots.  Diabetes.  Osteoporosis. Follow these instructions at home: Lifestyle  Do not use any products that contain nicotine or tobacco, such as cigarettes and e-cigarettes. If you need help quitting, ask your health care provider.  Eat a balanced diet that includes fresh fruits and vegetables, whole grains, soybeans, eggs, lean meat, and low-fat dairy.  Get at least 30 minutes of physical activity on 5 or more days each week.  Avoid alcoholic and caffeinated beverages, as well as spicy foods. This may help prevent hot flashes.  Get 7-8 hours of sleep each night.  Dress in layers that can be removed to help you manage hot flashes.  Find ways to manage stress, such as deep breathing, meditation, or journaling. General instructions  Keep track of your menstrual periods, including: ? When they occur. ? How heavy they are and how long they last. ? How much time passes between periods.  Keep track of your symptoms, noting when they start, how often you have them, and how long they last.  Take over-the-counter and prescription medicines only as told by your health care provider.  Take vitamin supplements only as told by your health care provider. These may include calcium, vitamin E, and vitamin D.  Use vaginal lubricants or moisturizers to help with vaginal dryness and improve comfort during sex.  Talk with your health care provider before starting any herbal supplements.  Keep all follow-up visits as told by your health care provider. This is important. This includes any group therapy or counseling. Contact a health care provider if:  You have heavy vaginal  bleeding or pass blood clots.  Your period lasts more than 2 days longer than normal.  Your periods are recurring sooner than 21 days.  You bleed after having sex. Get help  right away if:  You have chest pain, trouble breathing, or trouble talking.  You have severe depression.  You have pain when you urinate.  You have severe headaches.  You have vision problems. Summary  Perimenopause is the time when a woman's body begins to move into menopause. This may happen naturally or as a result of other health problems or medical treatments.  Perimenopause can begin 2-8 years before menopause, and it usually lasts for 1 year after menopause.  Perimenopausal symptoms can be managed through medicines, lifestyle changes, and complementary therapies such as acupuncture. This information is not intended to replace advice given to you by your health care provider. Make sure you discuss any questions you have with your health care provider. Document Revised: 04/01/2017 Document Reviewed: 05/25/2016 Elsevier Patient Education  2020 [employer].

## 2019-09-14 NOTE — Assessment & Plan Note (Signed)
Not doing well. Did not tolerate cymbalta. Will try low dose lexapro. Call with any concerns. Continue to monitor. Recheck 2 weeks.

## 2019-09-14 NOTE — Assessment & Plan Note (Signed)
Having mood swings- will treat that. Discussed herbal medications. Continue to monitor. Call with any concerns.

## 2019-09-14 NOTE — Progress Notes (Signed)
BP 139/80 (BP Location: Left Arm, Patient Position: Sitting, Cuff Size: Normal)   Pulse 70   Temp 98.1 F (36.7 C) (Oral)   Ht 5' 3.78" (1.62 m)   Wt 199 lb 6.4 oz (90.4 kg)   SpO2 99%   BMI 34.46 kg/m    Subjective:    Patient ID: Julia Jenkins, female    DOB: Apr 04, 1971, 49 y.o.   MRN: [id number]  HPI: Julia Jenkins is a 49 y.o. female  Chief Complaint  Patient presents with  . Anxiety  . Hypertension  . Menopause  . Breathing Problem    possibly due to anxiety per patient.    Has been doing a diet. Has lost almost 20 pounds.  HYPERTENSION- took her medicine for about 2 weeks, then stopped taking it and has been doing well without it Hypertension status: better  Satisfied with current treatment? yes BP monitoring frequency:  not checking BP medication side effects:  no Medication compliance: poor compliance Previous BP meds: lisinopril Aspirin: no Recurrent headaches: no Visual changes: no Palpitations: no Dyspnea: no Chest pain: no Lower extremity edema: no Dizzy/lightheaded: no  Feels like she can't take a deep breath. She notes that it makes her anxious when she cannot take a deep breath. She notes that she was vaping a bit and was concerned that that was the cause, but she stopped doing that. She notes that it definitely comes on when she gets anxious, especially with going [place]. She mainly notices it when she is resting and not busy. She notes that she feels very anxious with the masks as well. She is able to take a deep breath when she sits and calms herself. She will find herself holding her breath when she is watching movies.   ANXIETY/STRESS Duration:uncontrolled Anxious mood: yes  Excessive worrying: no Irritability: no  Sweating: no Nausea: no Palpitations:no Hyperventilation: yes Panic attacks: no Agoraphobia: no  Obscessions/compulsions: no Depressed mood: no Depression screen [facility] 2/9 09/14/2019 05/24/2019 03/02/2018  Decreased Interest 1  0 1  Down, Depressed, Hopeless 0 0 1  PHQ - 2 Score 1 0 2  Altered sleeping 0 3 2  Tired, decreased energy 2 2 2   Change in appetite 0 0 0  Feeling bad or failure about yourself  0 0 0  Trouble concentrating 0 3 2  Moving slowly or fidgety/restless 0 0 0  Suicidal thoughts 0 0 0  PHQ-9 Score 3 8 8   Difficult doing work/chores Not difficult at all Somewhat difficult -   GAD 7 : Generalized Anxiety Score 09/14/2019 05/24/2019  Nervous, Anxious, on Edge 2 0  Control/stop worrying 0 0  Worry too much - different things 0 0  Trouble relaxing 0 0  Restless 0 0  Easily annoyed or irritable 0 0  Afraid - awful might happen 0 0  Total GAD 7 Score 2 0  Anxiety Difficulty Somewhat difficult Not difficult at all   Anhedonia: no Weight changes: no Insomnia: yes hard to fall asleep  Hypersomnia: yes Fatigue/loss of energy: yes Feelings of worthlessness: no Feelings of guilt: no Impaired concentration/indecisiveness: no Suicidal ideations: no  Crying spells: no Recent Stressors/Life Changes: no   Relationship problems: no   Family stress: no     Financial stress: no    Job stress: no    Recent death/loss: no  Relevant past medical, surgical, family and social history reviewed and updated as indicated. Interim medical history since our last visit reviewed. Allergies and medications  reviewed and updated.  Review of Systems  Constitutional: Negative.   HENT: Negative.   Respiratory: Positive for chest tightness and shortness of breath. Negative for apnea, cough, choking, wheezing and stridor.   Cardiovascular: Negative.   Gastrointestinal: Negative.   Skin: Negative.   Neurological: Negative.   Psychiatric/Behavioral: Positive for sleep disturbance. Negative for agitation, behavioral problems, confusion, decreased concentration, dysphoric mood, hallucinations, self-injury and suicidal ideas. The patient is nervous/anxious. The patient is not hyperactive.     Per HPI unless  specifically indicated above     Objective:    BP 139/80 (BP Location: Left Arm, Patient Position: Sitting, Cuff Size: Normal)   Pulse 70   Temp 98.1 F (36.7 C) (Oral)   Ht 5' 3.78" (1.62 m)   Wt 199 lb 6.4 oz (90.4 kg)   SpO2 99%   BMI 34.46 kg/m   Wt Readings from Last 3 Encounters:  09/14/19 199 lb 6.4 oz (90.4 kg)  11/28/18 188 lb (85.3 kg)  06/16/18 207 lb (93.9 kg)    Physical Exam Vitals and nursing note reviewed.  Constitutional:      General: She is not in acute distress.    Appearance: Normal appearance. She is not ill-appearing, toxic-appearing or diaphoretic.  HENT:     Head: Normocephalic and atraumatic.     Right Ear: External ear normal.     Left Ear: External ear normal.     Nose: Nose normal.     Mouth/Throat:     Mouth: Mucous membranes are moist.     Pharynx: Oropharynx is clear.  Eyes:     General: No scleral icterus.       Right eye: No discharge.        Left eye: No discharge.     Extraocular Movements: Extraocular movements intact.     Conjunctiva/sclera: Conjunctivae normal.     Pupils: Pupils are equal, round, and reactive to light.  Cardiovascular:     Rate and Rhythm: Normal rate and regular rhythm.     Pulses: Normal pulses.     Heart sounds: Normal heart sounds. No murmur. No friction rub. No gallop.   Pulmonary:     Effort: Pulmonary effort is normal. No respiratory distress.     Breath sounds: Normal breath sounds. No stridor. No wheezing, rhonchi or rales.  Chest:     Chest wall: No tenderness.  Musculoskeletal:        General: Normal range of motion.     Cervical back: Normal range of motion and neck supple.  Skin:    General: Skin is warm and dry.     Capillary Refill: Capillary refill takes less than 2 seconds.     Coloration: Skin is not jaundiced or pale.     Findings: No bruising, erythema, lesion or rash.  Neurological:     General: No focal deficit present.     Mental Status: She is alert and oriented to person, place,  and time. Mental status is at baseline.  Psychiatric:        Mood and Affect: Mood is anxious.        Behavior: Behavior normal.        Thought Content: Thought content normal.        Judgment: Judgment normal.     Results for orders placed or performed in visit on 06/16/18  CBC with Differential/Platelet  Result Value Ref Range   WBC 4.0 3.4 - 10.8 x10E3/uL   RBC 4.45 3.77 - 5.28 x10E6/uL  Hemoglobin 12.9 11.1 - 15.9 g/dL   Hematocrit 39.2 34.0 - 46.6 %   MCV 88 79 - 97 fL   MCH 29.0 26.6 - 33.0 pg   MCHC 32.9 31.5 - 35.7 g/dL   RDW 13.3 11.7 - 15.4 %   Platelets 227 150 - 450 x10E3/uL   Neutrophils 58 Not Estab. %   Lymphs 26 Not Estab. %   Monocytes 12 Not Estab. %   Eos 3 Not Estab. %   Basos 1 Not Estab. %   Neutrophils Absolute 2.3 1.4 - 7.0 x10E3/uL   Lymphocytes Absolute 1.0 0.7 - 3.1 x10E3/uL   Monocytes Absolute 0.5 0.1 - 0.9 x10E3/uL   EOS (ABSOLUTE) 0.1 0.0 - 0.4 x10E3/uL   Basophils Absolute 0.0 0.0 - 0.2 x10E3/uL   Immature Granulocytes 0 Not Estab. %   Immature Grans (Abs) 0.0 0.0 - 0.1 x10E3/uL  Comprehensive metabolic panel  Result Value Ref Range   Glucose 93 65 - 99 mg/dL   BUN 19 6 - 24 mg/dL   Creatinine, Ser 0.64 0.57 - 1.00 mg/dL   GFR calc non Af Amer 105 >59 mL/min/1.73   GFR calc Af Amer 121 >59 mL/min/1.73   BUN/Creatinine Ratio 30 (H) 9 - 23   Sodium 139 134 - 144 mmol/L   Potassium 4.4 3.5 - 5.2 mmol/L   Chloride 104 96 - 106 mmol/L   CO2 23 20 - 29 mmol/L   Calcium 9.1 8.7 - 10.2 mg/dL   Total Protein 6.0 6.0 - 8.5 g/dL   Albumin 4.1 3.8 - 4.8 g/dL   Globulin, Total 1.9 1.5 - 4.5 g/dL   Albumin/Globulin Ratio 2.2 1.2 - 2.2   Bilirubin Total 0.2 0.0 - 1.2 mg/dL   Alkaline Phosphatase 85 39 - 117 IU/L   AST 12 0 - 40 IU/L   ALT 15 0 - 32 IU/L  H. pylori antibody, IgG([facility])  Result Value Ref Range   H. pylori, IgG AbS 0.18 0.00 - 0.79 Index Value      Assessment & Plan:   Problem List Items Addressed This Visit        Cardiovascular and Mediastinum   Essential hypertension    Doing well with weight loss off medicine. Continue to monitor. Call with any concerns.         Other   Menopause    Having mood swings- will treat that. Discussed herbal medications. Continue to monitor. Call with any concerns.       Relevant Orders   Comprehensive metabolic panel   CBC with Differential/Platelet   TSH   Lipid Panel w/o Chol/HDL Ratio   UA/M w/rflx Culture, Routine   Anxiety - Primary    Not doing well. Did not tolerate cymbalta. Will try low dose lexapro. Call with any concerns. Continue to monitor. Recheck 2 weeks.       Relevant Medications   escitalopram (LEXAPRO) 5 MG tablet    Other Visit Diagnoses    Multiple nevi       Would like to see dermatology. Referral generated today.   Relevant Orders   Ambulatory referral to Dermatology   SOB (shortness of breath)       Likely anxiety. Lungs clear. Will obtain CXR. Call with any concerns.    Relevant Orders   DG Chest 2 View       Follow up plan: Return in about 2 weeks (around 09/28/2019) for Physical, please give [place] address.

## 2019-09-14 NOTE — Assessment & Plan Note (Signed)
Doing well with weight loss off medicine. Continue to monitor. Call with any concerns.

## 2019-09-15 LAB — CBC WITH DIFFERENTIAL/PLATELET
Basophils Absolute: 0 10*3/uL (ref 0.0–0.2)
Basos: 0 %
EOS (ABSOLUTE): 0.1 10*3/uL (ref 0.0–0.4)
Eos: 1 %
Hematocrit: 44.5 % (ref 34.0–46.6)
Hemoglobin: 14.6 g/dL (ref 11.1–15.9)
Immature Grans (Abs): 0 10*3/uL (ref 0.0–0.1)
Immature Granulocytes: 0 %
Lymphocytes Absolute: 1.1 10*3/uL (ref 0.7–3.1)
Lymphs: 21 %
MCH: 28.3 pg (ref 26.6–33.0)
MCHC: 32.8 g/dL (ref 31.5–35.7)
MCV: 86 fL (ref 79–97)
Monocytes Absolute: 0.5 10*3/uL (ref 0.1–0.9)
Monocytes: 9 %
Neutrophils Absolute: 3.5 10*3/uL (ref 1.4–7.0)
Neutrophils: 69 %
Platelets: 234 10*3/uL (ref 150–450)
RBC: 5.16 x10E6/uL (ref 3.77–5.28)
RDW: 13.3 % (ref 11.7–15.4)
WBC: 5.1 10*3/uL (ref 3.4–10.8)

## 2019-09-15 LAB — COMPREHENSIVE METABOLIC PANEL
ALT: 17 IU/L (ref 0–32)
AST: 17 IU/L (ref 0–40)
Albumin/Globulin Ratio: 1.8 (ref 1.2–2.2)
Albumin: 4.4 g/dL (ref 3.8–4.9)
Alkaline Phosphatase: 119 IU/L — ABNORMAL HIGH (ref 39–117)
BUN/Creatinine Ratio: 23 (ref 9–23)
BUN: 17 mg/dL (ref 6–24)
Bilirubin Total: 0.3 mg/dL (ref 0.0–1.2)
CO2: 25 mmol/L (ref 20–29)
Calcium: 9.6 mg/dL (ref 8.7–10.2)
Chloride: 102 mmol/L (ref 96–106)
Creatinine, Ser: 0.73 mg/dL (ref 0.57–1.00)
GFR calc Af Amer: 110 mL/min/{1.73_m2} (ref >59)
GFR calc non Af Amer: 96 mL/min/{1.73_m2} (ref >59)
Globulin, Total: 2.4 g/dL (ref 1.5–4.5)
Glucose: 85 mg/dL (ref 65–99)
Potassium: 4 mmol/L (ref 3.5–5.2)
Sodium: 140 mmol/L (ref 134–144)
Total Protein: 6.8 g/dL (ref 6.0–8.5)

## 2019-09-15 LAB — TSH: TSH: 5.28 u[IU]/mL — ABNORMAL HIGH (ref 0.450–4.500)

## 2019-09-15 LAB — LIPID PANEL W/O CHOL/HDL RATIO
Cholesterol, Total: 175 mg/dL (ref 100–199)
HDL: 42 mg/dL (ref >39)
LDL Chol Calc (NIH): 116 mg/dL — ABNORMAL HIGH (ref 0–99)
Triglycerides: 90 mg/dL (ref 0–149)
VLDL Cholesterol Cal: 17 mg/dL (ref 5–40)

## 2019-09-17 ENCOUNTER — Telehealth: Payer: Self-pay | Admitting: Family Medicine

## 2019-09-17 ENCOUNTER — Other Ambulatory Visit: Payer: Self-pay | Admitting: Family Medicine

## 2019-09-17 ENCOUNTER — Ambulatory Visit: Payer: Self-pay

## 2019-09-17 ENCOUNTER — Other Ambulatory Visit: Payer: Self-pay

## 2019-09-17 ENCOUNTER — Ambulatory Visit
Admission: RE | Admit: 2019-09-17 | Discharge: 2019-09-17 | Disposition: A | Discharge status: Home / Self Care | Payer: BC Managed Care – PPO | Source: Ambulatory Visit | Attending: Family Medicine | Admitting: Family Medicine

## 2019-09-17 DIAGNOSIS — R918 Other nonspecific abnormal finding of lung field: Secondary | ICD-10-CM

## 2019-09-17 DIAGNOSIS — R0602 Shortness of breath: Secondary | ICD-10-CM | POA: Diagnosis not present

## 2019-09-17 NOTE — Telephone Encounter (Signed)
Copied from [place] [phone]. Topic: General - Other >> Sep 17, 2019 11:53 AM Jaynie Collins D wrote: Reason for CRM: [facility] wanted to go over patients x-ray results [phone] phone number

## 2019-09-17 NOTE — Telephone Encounter (Signed)
Report taken, provider aware.

## 2019-09-26 ENCOUNTER — Telehealth: Payer: Self-pay | Admitting: Family Medicine

## 2019-09-26 ENCOUNTER — Encounter: Payer: Self-pay | Admitting: Family Medicine

## 2019-09-26 DIAGNOSIS — R918 Other nonspecific abnormal finding of lung field: Secondary | ICD-10-CM

## 2019-09-26 DIAGNOSIS — R0602 Shortness of breath: Secondary | ICD-10-CM

## 2019-09-26 NOTE — Telephone Encounter (Signed)
Called to do peer to peer on CT chest due to symptoms, they will not pay for screening but want regular CT. Ordered.   [id number] CPT  Authorization # [id number] Expiration: 11/23/19  Was listed as being done at [facility]. We do not have a CT. Needs to have facility changed to [facility] with insurance prior to CT being done. Please call to have facility location changed so that authorization number is accurate.

## 2019-09-26 NOTE — Telephone Encounter (Signed)
Insurance Denied Order for CT CHEST LUNG CANCER SCREENING LOW DOSE WO CONTRAST. Spoke with insurance and they would like the PCP to call and review with a Provider.  [place]

## 2019-09-28 NOTE — Telephone Encounter (Signed)
Called and changed location to [facility].

## 2019-09-28 NOTE — Telephone Encounter (Signed)
Can we please confirm that the facility was changed with the insurance?

## 2019-09-28 NOTE — Telephone Encounter (Signed)
Can you check to make sure that the facility for the CT scan is [facility] and not [facility], this has to be updated so that the insurance will cover it, if the location is wrong the approval will not work.

## 2019-10-03 ENCOUNTER — Ambulatory Visit: Payer: BC Managed Care – PPO

## 2019-10-10 ENCOUNTER — Other Ambulatory Visit: Payer: Self-pay

## 2019-10-10 ENCOUNTER — Ambulatory Visit
Admission: RE | Admit: 2019-10-10 | Discharge: 2019-10-10 | Disposition: A | Discharge status: Home / Self Care | Payer: BC Managed Care – PPO | Source: Ambulatory Visit | Attending: Family Medicine | Admitting: Family Medicine

## 2019-10-10 DIAGNOSIS — R918 Other nonspecific abnormal finding of lung field: Secondary | ICD-10-CM | POA: Insufficient documentation

## 2019-10-10 DIAGNOSIS — R0602 Shortness of breath: Secondary | ICD-10-CM

## 2019-10-11 ENCOUNTER — Telehealth: Payer: Self-pay

## 2019-10-11 MED ORDER — ESCITALOPRAM OXALATE 5 MG PO TABS: 5.0000 mg | ORAL_TABLET | Freq: Every day | ORAL | 2 refills | Status: DC

## 2019-10-11 NOTE — Telephone Encounter (Signed)
Patient states that she has been taking the whole tablet of Lexapro instead of a half, she states that it has been working very well.

## 2019-11-07 ENCOUNTER — Other Ambulatory Visit: Payer: Self-pay

## 2019-11-07 ENCOUNTER — Ambulatory Visit (INDEPENDENT_AMBULATORY_CARE_PROVIDER_SITE_OTHER): Payer: BC Managed Care – PPO | Admitting: Dermatology

## 2019-11-07 DIAGNOSIS — D2239 Melanocytic nevi of other parts of face: Secondary | ICD-10-CM

## 2019-11-07 DIAGNOSIS — L821 Other seborrheic keratosis: Secondary | ICD-10-CM

## 2019-11-07 DIAGNOSIS — D2272 Melanocytic nevi of left lower limb, including hip: Secondary | ICD-10-CM

## 2019-11-07 DIAGNOSIS — D2372 Other benign neoplasm of skin of left lower limb, including hip: Secondary | ICD-10-CM

## 2019-11-07 DIAGNOSIS — D2271 Melanocytic nevi of right lower limb, including hip: Secondary | ICD-10-CM

## 2019-11-07 DIAGNOSIS — L304 Erythema intertrigo: Secondary | ICD-10-CM

## 2019-11-07 DIAGNOSIS — D1800 Hemangioma unspecified site: Secondary | ICD-10-CM

## 2019-11-07 DIAGNOSIS — L814 Other melanin hyperpigmentation: Secondary | ICD-10-CM

## 2019-11-07 DIAGNOSIS — Z1283 Encounter for screening for malignant neoplasm of skin: Secondary | ICD-10-CM

## 2019-11-07 DIAGNOSIS — D2261 Melanocytic nevi of right upper limb, including shoulder: Secondary | ICD-10-CM

## 2019-11-07 DIAGNOSIS — D229 Melanocytic nevi, unspecified: Secondary | ICD-10-CM

## 2019-11-07 DIAGNOSIS — D225 Melanocytic nevi of trunk: Secondary | ICD-10-CM

## 2019-11-07 DIAGNOSIS — D2262 Melanocytic nevi of left upper limb, including shoulder: Secondary | ICD-10-CM

## 2019-11-07 DIAGNOSIS — I8393 Asymptomatic varicose veins of bilateral lower extremities: Secondary | ICD-10-CM

## 2019-11-07 DIAGNOSIS — L578 Other skin changes due to chronic exposure to nonionizing radiation: Secondary | ICD-10-CM

## 2019-11-07 DIAGNOSIS — D239 Other benign neoplasm of skin, unspecified: Secondary | ICD-10-CM

## 2019-11-07 DIAGNOSIS — L918 Other hypertrophic disorders of the skin: Secondary | ICD-10-CM

## 2019-11-07 DIAGNOSIS — L813 Cafe au lait spots: Secondary | ICD-10-CM

## 2019-11-07 NOTE — Patient Instructions (Addendum)
Melanoma ABCDEs  Melanoma is the most dangerous type of skin cancer, and is the leading cause of death from skin disease.  You are more likely to develop melanoma if you:  Have light-colored skin, light-colored eyes, or red or blond hair  Spend a lot of time in the sun  Tan regularly, either outdoors or in a tanning bed  Have had blistering sunburns, especially during childhood  Have a close family member who has had a melanoma  Have atypical moles or large birthmarks  Early detection of melanoma is key since treatment is typically straightforward and cure rates are extremely high if we catch it early.   The first sign of melanoma is often a change in a mole or a new dark spot.  The ABCDE system is a way of remembering the signs of melanoma.  A for asymmetry:  The two halves do not match. B for border:  The edges of the growth are irregular. C for color:  A mixture of colors are present instead of an even brown color. D for diameter:  Melanomas are usually (but not always) greater than 6mm - the size of a pencil eraser. E for evolution:  The spot keeps changing in size, shape, and color.  Please check your skin once per month between visits. You can use a small mirror in front and a large mirror behind you to keep an eye on the back side or your body.   If you see any new or changing lesions before your next follow-up, please call to schedule a visit.  Please continue daily skin protection including broad spectrum sunscreen SPF 30+ to sun-exposed areas, reapplying every 2 hours as needed when you're outdoors.   Seborrheic Keratosis  What causes seborrheic keratoses? Seborrheic keratoses are harmless, common skin growths that first appear during adult life.  As time goes by, more growths appear.  Some people may develop a large number of them.  Seborrheic keratoses appear on both covered and uncovered body parts.  They are not caused by sunlight.  The tendency to develop seborrheic  keratoses can be inherited.  They vary in color from skin-colored to gray, brown, or even black.  They can be either smooth or have a rough, warty surface.   Seborrheic keratoses are superficial and look as if they were stuck on the skin.  Under the microscope this type of keratosis looks like layers upon layers of skin.  That is why at times the top layer may seem to fall off, but the rest of the growth remains and re-grows.    Treatment Seborrheic keratoses do not need to be treated, but can easily be removed in the office.  Seborrheic keratoses often cause symptoms when they rub on clothing or jewelry.  Lesions can be in the way of shaving.  If they become inflamed, they can cause itching, soreness, or burning.  Removal of a seborrheic keratosis can be accomplished by freezing, burning, or surgery. If any spot bleeds, scabs, or grows rapidly, please return to have it checked, as these can be an indication of a skin cancer.  

## 2019-11-07 NOTE — Progress Notes (Signed)
New Patient Visit  Subjective  Julia Jenkins is a 49 y.o. female who presents for the following: Annual Exam (patient has noticed lesions on her leg that she would like checked).  The following portions of the chart were reviewed this encounter and updated as appropriate:     Review of Systems:  No other skin or systemic complaints except as noted in HPI or Assessment and Plan.  Objective  Well appearing patient in no apparent distress; mood and affect are within normal limits.  A full examination was performed including scalp, head, eyes, ears, nose, lips, neck, chest, axillae, abdomen, back, buttocks, bilateral upper extremities, bilateral lower extremities, hands, feet, fingers, toes, fingernails, and toenails. All findings within normal limits unless otherwise noted below.  Objective  L lower leg x 2: Firm pink/brown papulenodule with dimple sign.   Objective  Inframammary: Clear today.  Increased moisture/sweat  Objective  R post shoulder: 0.4 x 0.5 cm medium brown macule   L ant shoulder: 0.3 cm medium dark brown macule   L med knee: 0.2 cm medium brown macule  R med upper thigh: 0.4 x 0.25 cm medium brown macule - two toned  Objective  R nasal tip: 0.2 cm firm flesh colored papule  Assessment & Plan  Dermatofibroma L lower leg x 2  Benign, observe.  Discussed shave removal vrs surgical excision, but neither are recommended at this time due to risk of recurrence and scarring.   Erythema intertrigo Inframammary  Continue Nystatin powder daily. Or can try OTC Zeasorb AF powder daily.   Nevus (4) L med knee; L ant shoulder; R med upper thigh; R post shoulder  Vs lentigo (R post shoulder)  Benign-appearing.  Observation.  Call clinic for new or changing moles.  Recommend daily use of broad spectrum spf 30+ sunscreen to sun-exposed areas.    Fibrous papule of nose R nasal tip  Benign, observe.  Discussed resulting small scar with shave removal, and  possible recurrence of lesion.     Lentigines - trunk, extremities - Scattered tan macules - Discussed due to sun exposure - Benign, observe - Call for any changes - Discussed BBL cosmetic laser removal   Seborrheic Keratoses - inframammary - Stuck-on, waxy, tan-brown papules and plaques  - Discussed benign etiology and prognosis. - Observe - Call for any changes  Melanocytic Nevi - trunk, extremities - Tan-brown and/or pink-flesh-colored symmetric macules and papules - Benign appearing on exam today - Observation - Call clinic for new or changing moles - Recommend daily use of broad spectrum spf 30+ sunscreen to sun-exposed areas.   Hemangiomas - trunk - Red papules - Discussed benign nature - Observe - Call for any changes  Actinic Damage - chest  - diffuse scaly erythematous macules with underlying dyspigmentation - Recommend daily broad spectrum sunscreen SPF 30+ to sun-exposed areas, reapply every 2 hours as needed.  - Call for new or changing lesions.  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  - Observe. - If desired, they can be removed with an in office procedure that is not covered by insurance. - Please call the clinic if you notice any new or changing lesions.  Varicose Veins - B/L legs - Dilated blue, purple or red veins at the lower extremities - Reassured - These can be treated by sclerotherapy (a  procedure to inject a medicine into the veins to make them disappear) if desired, but the treatment is not covered by insurance  Skin cancer screening performed today.  Return in about 1 year (around 11/06/2020) for TBSE.  Luther Redo, CMA, am acting as scribe for Brendolyn Patty, MD .  Documentation: I have reviewed the above documentation for accuracy and completeness, and I agree with the  above.  Brendolyn Patty MD

## 2020-01-10 ENCOUNTER — Ambulatory Visit: Payer: Self-pay | Admitting: Family Medicine

## 2020-01-10 NOTE — Telephone Encounter (Signed)
Scheduled 01/15/20 at 2:40

## 2020-01-10 NOTE — Telephone Encounter (Signed)
Pt reports increased fatigue x 2 months. States "A pattern, I get lethargic around 1:30-2:00 in the afternoons." States does not occur daily. No new meds, takes Lexapro each AM. States is sleeping well at night. States "I feel like my blood stream is just weird." Pt could not elaborate, states "Not like my blood pressure, just strange feeling." Denies CP, no SOB, denies aches/pain. Pt states would like in office visit as "Last 2-3 have been virtual." Next available Tues 14th at 1520 is marked Same Day.  NT could not call office as phone system presently not working. Please advise:   [phone]  Reason for Disposition . [1] Fatigue (i.e., tires easily, decreased energy) AND [2] persists > 1 week  Answer Assessment - Initial Assessment Questions 1. DESCRIPTION: "Describe how you are feeling."     Fatigued, Like my blood stream is weird 2. SEVERITY: "How bad is it?"  "Can you stand and walk?"   - MILD - Feels weak or tired, but does not interfere with work, school or normal activities   - [place] to stand and walk; weakness interferes with work, school, or normal activities   - SEVERE - Unable to stand or walk     Mild-moderate 3. ONSET:  "When did the weakness begin?"     2 months  ago 4. CAUSE: "What do you think is causing the weakness?"     Not sure 5. MEDICINES: "Have you recently started a new medicine or had a change in the amount of a medicine?"     no 6. OTHER SYMPTOMS: "Do you have any other symptoms?" (e.g., chest pain, fever, cough, SOB, vomiting, diarrhea, bleeding, other areas of pain)     none  Protocols used: WEAKNESS (GENERALIZED) AND FATIGUE-A-AH

## 2020-01-15 ENCOUNTER — Encounter: Payer: Self-pay | Admitting: Family Medicine

## 2020-01-15 ENCOUNTER — Ambulatory Visit: Payer: BC Managed Care – PPO | Admitting: Family Medicine

## 2020-01-15 ENCOUNTER — Other Ambulatory Visit: Payer: Self-pay

## 2020-01-15 VITALS — BP 126/84 | HR 86 | Temp 95.5°F | Ht 64.0 in | Wt 208.0 lb

## 2020-01-15 DIAGNOSIS — R300 Dysuria: Secondary | ICD-10-CM | POA: Diagnosis not present

## 2020-01-15 DIAGNOSIS — F419 Anxiety disorder, unspecified: Secondary | ICD-10-CM | POA: Diagnosis not present

## 2020-01-15 DIAGNOSIS — R5382 Chronic fatigue, unspecified: Secondary | ICD-10-CM

## 2020-01-15 DIAGNOSIS — R079 Chest pain, unspecified: Secondary | ICD-10-CM

## 2020-01-15 MED ORDER — CIPROFLOXACIN HCL 500 MG PO TABS: 500.0000 mg | ORAL_TABLET | Freq: Two times a day (BID) | ORAL | 0 refills | Status: DC

## 2020-01-15 MED ORDER — ESCITALOPRAM OXALATE 10 MG PO TABS: 10.0000 mg | ORAL_TABLET | Freq: Every day | ORAL | 3 refills | Status: DC

## 2020-01-15 NOTE — Progress Notes (Signed)
BP 126/84 (BP Location: Right Arm, Patient Position: Sitting)    Pulse 86    Temp (!) 95.5 F (35.3 C) (Oral)    Ht 5\' 4"  (1.626 m)    Wt 208 lb (94.3 kg)    SpO2 95%    BMI 35.70 kg/m    Subjective:    Patient ID: Julia Jenkins, female    DOB: November 20, 1970, 49 y.o.   MRN: [id number]  HPI: Nekayla Heider is a 49 y.o. female  Chief Complaint  Patient presents with   Fatigue    X2-3 months, comes and goes, very tired, pt gets enough sleep at night, feels drained   body pain    X2-3 months off and on, continuos problem, has had this happen before   URINARY SYMPTOMS- Thinks that she had a kidney infection about a week ago. Duration: about a week Dysuria: yes Urinary frequency: yes Urgency: yes Small volume voids: yes Symptom severity: moderate Urinary incontinence: no Foul odor: yes Hematuria: no Abdominal pain: yes Back pain: yes Suprapubic pain/pressure: yes Flank pain: yes Fever:  no Vomiting: no Relief with cranberry juice: no Relief with pyridium: no Status: stable Previous urinary tract infection: yes Recurrent urinary tract infection: no Vaginal discharge: no Treatments attempted: pyridium, cranberry and increasing fluids   CHEST PAIN Time since onset: last night and a few days ago Onset: sudden Quality: sharp Severity: severe Location: L shoulder blade Radiation: none Episode duration: 20 minutes Frequency: 1x  Related to exertion: no Activity when pain started:  Trauma: no Anxiety/recent stressors: yes Status: worse Treatments attempted: nothing  Current pain status: in pain Shortness of breath: no Cough: no Nausea: no Diaphoresis: no Heartburn: no Palpitations: no  DEPRESSION- has had a lot of stress. 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 She is thinking the lexapro is helping, but not enough, still incredibly tired.  Mood status: better Satisfied  with current treatment?: no Symptom severity: moderate  Duration of current treatment : few months Side effects: no Medication compliance: good compliance Psychotherapy/counseling: no  Previous psychiatric medications: lexapro Depressed mood: yes Anxious mood: yes Anhedonia: no Significant weight loss or gain: no Insomnia: yes hard to fall asleep Fatigue: yes Feelings of worthlessness or guilt: no Impaired concentration/indecisiveness: yes Suicidal ideations: no Hopelessness: no Crying spells: no Depression screen [facility] 2/9 01/15/2020 09/14/2019 05/24/2019 03/02/2018  Decreased Interest 3 1 0 1  Down, Depressed, Hopeless 0 0 0 1  PHQ - 2 Score 3 1 0 2  Altered sleeping 0 0 3 2  Tired, decreased energy 3 2 2 2   Change in appetite 0 0 0 0  Feeling bad or failure about yourself  0 0 0 0  Trouble concentrating 0 0 3 2  Moving slowly or fidgety/restless 0 0 0 0  Suicidal thoughts 0 0 0 0  PHQ-9 Score 6 3 8 8   Difficult doing work/chores Somewhat difficult Not difficult at all Somewhat difficult -    Relevant past medical, surgical, family and social history reviewed and updated as indicated. Interim medical history since our last visit reviewed. Allergies and medications reviewed and updated.  Review of Systems  Constitutional: Positive for fatigue. Negative for activity change, appetite change, chills, diaphoresis, fever and unexpected weight change.  HENT: Negative.   Respiratory: Positive for chest tightness and shortness of breath. Negative for apnea, cough, choking, wheezing and stridor.  Cardiovascular: Positive for chest pain. Negative for palpitations and leg swelling.  Gastrointestinal: Negative.   Musculoskeletal: Negative.   Skin: Negative.   Neurological: Negative.   Psychiatric/Behavioral: Positive for agitation, dysphoric mood and sleep disturbance. Negative for behavioral problems, confusion, decreased concentration, hallucinations, self-injury and suicidal ideas.  The patient is nervous/anxious. The patient is not hyperactive.     Per HPI unless specifically indicated above     Objective:    BP 126/84 (BP Location: Right Arm, Patient Position: Sitting)    Pulse 86    Temp (!) 95.5 F (35.3 C) (Oral)    Ht 5\' 4"  (1.626 m)    Wt 208 lb (94.3 kg)    SpO2 95%    BMI 35.70 kg/m   Wt Readings from Last 3 Encounters:  01/15/20 208 lb (94.3 kg)  09/14/19 199 lb 6.4 oz (90.4 kg)  11/28/18 188 lb (85.3 kg)    Physical Exam Vitals and nursing note reviewed.  Constitutional:      General: She is not in acute distress.    Appearance: Normal appearance. She is not ill-appearing, toxic-appearing or diaphoretic.  HENT:     Head: Normocephalic and atraumatic.     Right Ear: External ear normal.     Left Ear: External ear normal.     Nose: Nose normal.     Mouth/Throat:     Mouth: Mucous membranes are moist.     Pharynx: Oropharynx is clear.  Eyes:     General: No scleral icterus.       Right eye: No discharge.        Left eye: No discharge.     Extraocular Movements: Extraocular movements intact.     Conjunctiva/sclera: Conjunctivae normal.     Pupils: Pupils are equal, round, and reactive to light.  Cardiovascular:     Rate and Rhythm: Normal rate and regular rhythm.     Pulses: Normal pulses.     Heart sounds: Normal heart sounds. No murmur heard.  No friction rub. No gallop.   Pulmonary:     Effort: Pulmonary effort is normal. No respiratory distress.     Breath sounds: Normal breath sounds. No stridor. No wheezing, rhonchi or rales.  Chest:     Chest wall: No tenderness.  Musculoskeletal:        General: Normal range of motion.     Cervical back: Normal range of motion and neck supple.  Skin:    General: Skin is warm and dry.     Capillary Refill: Capillary refill takes less than 2 seconds.     Coloration: Skin is not jaundiced or pale.     Findings: No bruising, erythema, lesion or rash.  Neurological:     General: No focal deficit  present.     Mental Status: She is alert and oriented to person, place, and time. Mental status is at baseline.  Psychiatric:        Mood and Affect: Mood normal.        Behavior: Behavior normal.        Thought Content: Thought content normal.        Judgment: Judgment normal.     Results for orders placed or performed in visit on 01/15/20  Microscopic Examination   Urine  Result Value Ref Range   WBC, UA 0-5 0 - 5 /hpf   RBC None seen 0 - 2 /hpf   Epithelial Cells (non renal) 0-10 0 - 10 /hpf   Bacteria, UA  Moderate (A) None seen/Few  Urine Culture, Reflex   Urine  Result Value Ref Range   Urine Culture, Routine WILL FOLLOW   UA/M w/rflx Culture, Routine   Specimen: Urine   Urine  Result Value Ref Range   Specific Gravity, UA 1.025 1.005 - 1.030   pH, UA 6.0 5.0 - 7.5   Color, UA Yellow Yellow   Appearance Ur Clear Clear   Leukocytes,UA 1+ (A) Negative   Protein,UA Negative Negative/Trace   Glucose, UA Negative Negative   Ketones, UA Negative Negative   RBC, UA Negative Negative   Bilirubin, UA Negative Negative   Urobilinogen, Ur 1.0 0.2 - 1.0 mg/dL   Nitrite, UA Negative Negative   Microscopic Examination See below:    Urinalysis Reflex Comment       Assessment & Plan:   Problem List Items Addressed This Visit      Other   Anxiety    Likely contributing to her fatigue and body pain. Will increase her lexapro from 5mg  to 10 mg and recheck 2 weeks. Continue to monitor closely.       Relevant Medications   escitalopram (LEXAPRO) 10 MG tablet   Chronic fatigue    Mood likely contributing. Saw sleep medicine in April for sleep study- results not available at this time. Will obtain records. Treat with lexapro and recheck 2 weeks. Given chest pain and family history of sudden cardiac death at 36- will get her into cardiology for evaluation. Await their input.        Other Visit Diagnoses    Chest pain, unspecified type    -  Primary   New sudden onset. Concern  for anxiety, but given family history of early cardiac death and flipped t-waves on EKG without comparison, will get her into cards.   Relevant Orders   EKG 12-Lead (Completed)   Ambulatory referral to Cardiology   Dysuria       Will check UA. Await results. Treat as needed.    Relevant Orders   UA/M w/rflx Culture, Routine (Completed)       Follow up plan: Return in about 2 weeks (around 01/29/2020).

## 2020-01-16 DIAGNOSIS — I10 Essential (primary) hypertension: Secondary | ICD-10-CM | POA: Diagnosis not present

## 2020-01-16 DIAGNOSIS — R079 Chest pain, unspecified: Secondary | ICD-10-CM | POA: Diagnosis not present

## 2020-01-16 DIAGNOSIS — R5382 Chronic fatigue, unspecified: Secondary | ICD-10-CM | POA: Insufficient documentation

## 2020-01-16 DIAGNOSIS — Z8249 Family history of ischemic heart disease and other diseases of the circulatory system: Secondary | ICD-10-CM | POA: Diagnosis not present

## 2020-01-16 NOTE — Assessment & Plan Note (Signed)
Likely contributing to her fatigue and body pain. Will increase her lexapro from 5mg  to 10 mg and recheck 2 weeks. Continue to monitor closely.

## 2020-01-16 NOTE — Assessment & Plan Note (Signed)
Mood likely contributing. Saw sleep medicine in April for sleep study- results not available at this time. Will obtain records. Treat with lexapro and recheck 2 weeks. Given chest pain and family history of sudden cardiac death at 34- will get her into cardiology for evaluation. Await their input.

## 2020-01-17 LAB — UA/M W/RFLX CULTURE, ROUTINE
Bilirubin, UA: NEGATIVE
Glucose, UA: NEGATIVE
Ketones, UA: NEGATIVE
Nitrite, UA: NEGATIVE
Protein,UA: NEGATIVE
RBC, UA: NEGATIVE
Specific Gravity, UA: 1.025 (ref 1.005–1.030)
Urobilinogen, Ur: 1 mg/dL (ref 0.2–1.0)
pH, UA: 6 (ref 5.0–7.5)

## 2020-01-17 LAB — MICROSCOPIC EXAMINATION: RBC, Urine: NONE SEEN /hpf (ref 0–2)

## 2020-01-17 LAB — URINE CULTURE, REFLEX

## 2020-01-20 ENCOUNTER — Encounter: Payer: Self-pay | Admitting: Family Medicine

## 2020-01-27 NOTE — Progress Notes (Signed)
Interpreted by me on 01/15/20 NSR at 71 bpm with flipped t-waves in V1 and V2

## 2020-01-30 ENCOUNTER — Ambulatory Visit: Payer: BC Managed Care – PPO | Admitting: Family Medicine

## 2020-01-30 ENCOUNTER — Encounter: Payer: Self-pay | Admitting: Family Medicine

## 2020-01-30 ENCOUNTER — Other Ambulatory Visit: Payer: Self-pay

## 2020-01-30 VITALS — BP 124/81 | HR 82 | Temp 98.2°F | Ht 64.0 in | Wt 209.2 lb

## 2020-01-30 DIAGNOSIS — R39198 Other difficulties with micturition: Secondary | ICD-10-CM

## 2020-01-30 DIAGNOSIS — M545 Low back pain, unspecified: Secondary | ICD-10-CM

## 2020-01-30 DIAGNOSIS — R5382 Chronic fatigue, unspecified: Secondary | ICD-10-CM

## 2020-01-30 DIAGNOSIS — F419 Anxiety disorder, unspecified: Secondary | ICD-10-CM

## 2020-01-30 LAB — UA/M W/RFLX CULTURE, ROUTINE
Bilirubin, UA: NEGATIVE
Glucose, UA: NEGATIVE
Ketones, UA: NEGATIVE
Leukocytes,UA: NEGATIVE
Nitrite, UA: NEGATIVE
Protein,UA: NEGATIVE
RBC, UA: NEGATIVE
Specific Gravity, UA: 1.02 (ref 1.005–1.030)
Urobilinogen, Ur: 0.2 mg/dL (ref 0.2–1.0)
pH, UA: 6.5 (ref 5.0–7.5)

## 2020-01-30 MED ORDER — CYCLOBENZAPRINE HCL 10 MG PO TABS: 10.0000 mg | ORAL_TABLET | Freq: Every day | ORAL | 0 refills | Status: DC

## 2020-01-30 MED ORDER — NAPROXEN 500 MG PO TABS: 500.0000 mg | ORAL_TABLET | Freq: Two times a day (BID) | ORAL | 2 refills | Status: DC

## 2020-01-30 NOTE — Progress Notes (Signed)
BP 124/81 (BP Location: Left Arm, Patient Position: Sitting, Cuff Size: Large)   Pulse 82   Temp 98.2 F (36.8 C) (Oral)   Ht 5\' 4"  (1.626 m)   Wt 209 lb 3.2 oz (94.9 kg)   SpO2 97%   BMI 35.91 kg/m    Subjective:    Patient ID: Julia Jenkins, female    DOB: 05/13/1970, 49 y.o.   MRN: [id number]  HPI: Julia Jenkins is a 49 y.o. female  Chief Complaint  Patient presents with  . Anxiety   Went to see cardiology shortly after our last visit. They are getting her set with a stress test, which has not been done yet. She is scheduled to see them again next week. Has not had any pains since last visit.   ANXIETY/STRESS- continues to feel really tired. Napping during the day, then waking up in the middle of the night.  Duration: several months Status:better Anxious mood: yes  Excessive worrying: yes Irritability: yes  Sweating: no Nausea: yes Palpitations:yes Hyperventilation: no Panic attacks: no Agoraphobia: no  Obscessions/compulsions: no Depressed mood: no Depression screen [facility] 2/9 01/30/2020 01/15/2020 09/14/2019 05/24/2019 03/02/2018  Decreased Interest 0 3 1 0 1  Down, Depressed, Hopeless 0 0 0 0 1  PHQ - 2 Score 0 3 1 0 2  Altered sleeping 2 0 0 3 2  Tired, decreased energy 0 3 2 2 2   Change in appetite 0 0 0 0 0  Feeling bad or failure about yourself  0 0 0 0 0  Trouble concentrating 1 0 0 3 2  Moving slowly or fidgety/restless 0 0 0 0 0  Suicidal thoughts 0 0 0 0 0  PHQ-9 Score 3 6 3 8 8   Difficult doing work/chores Not difficult at all Somewhat difficult Not difficult at all Somewhat difficult -   GAD 7 : Generalized Anxiety Score 01/30/2020 01/15/2020 09/14/2019 05/24/2019  Nervous, Anxious, on Edge 0 0 2 0  Control/stop worrying 0 0 0 0  Worry too much - different things 0 0 0 0  Trouble relaxing 0 0 0 0  Restless 0 0 0 0  Easily annoyed or irritable 0 1 0 0  Afraid - awful might happen 0 0 0 0  Total GAD 7 Score 0 1 2 0  Anxiety Difficulty Not difficult at all Not  difficult at all Somewhat difficult Not difficult at all   Anhedonia: no Weight changes: no Insomnia: yes hard to stay asleep  Hypersomnia: yes Fatigue/loss of energy: yes Feelings of worthlessness: no Feelings of guilt: no Impaired concentration/indecisiveness: no Suicidal ideations: no  Crying spells: no Recent Stressors/Life Changes: yes   Relationship problems: no   Family stress: yes     Financial stress: yes    Job stress: yes    Recent death/loss: no  BACK PAIN Duration: couple of weeks Mechanism of injury: lifting Location: bilateral and low back Onset: sudden Severity: severe 8/10 Quality: sharp Frequency: intermittent Radiation: none Aggravating factors: laying Alleviating factors: movement, rubbing, tylenol, ibuprofen Status: fluctuating Treatments attempted: hydrocodone, rest, APAP and ibuprofen  Relief with NSAIDs?: mild Nighttime pain:  yes Paresthesias / decreased sensation:  no Bowel / bladder incontinence:  no Fevers:  no Dysuria / urinary frequency:  yes  URINARY SYMPTOMS- sometimes, can't pee. Feels like she has to focus to pee Duration: 2 weeks ago Dysuria: no Urinary frequency: yes Urgency: yes Small volume voids: yes Symptom severity: moderate Urinary incontinence: no Foul odor: no Hematuria: no Abdominal  pain: no Back pain: no Suprapubic pain/pressure: no Flank pain: no Fever:  no Vomiting: no Relief with cranberry juice: no Relief with pyridium: no Status: stable Previous urinary tract infection: yes Recurrent urinary tract infection: no Sexual activity: monogomous History of sexually transmitted disease: no Vaginal discharge: no Treatments attempted: increasing fluids   Hypersexuality for the past couple of weeks. Has been having some issues with orgasm. Has not been having negative consequences or bothering her in her life. She has always had a higher libido.   Relevant past medical, surgical, family and social history  reviewed and updated as indicated. Interim medical history since our last visit reviewed. Allergies and medications reviewed and updated.  Review of Systems  Constitutional: Positive for fatigue. Negative for activity change, appetite change, chills, diaphoresis, fever and unexpected weight change.  HENT: Negative.   Respiratory: Negative.   Cardiovascular: Negative.   Gastrointestinal: Negative.   Genitourinary: Negative.   Musculoskeletal: Negative.     Per HPI unless specifically indicated above     Objective:    BP 124/81 (BP Location: Left Arm, Patient Position: Sitting, Cuff Size: Large)   Pulse 82   Temp 98.2 F (36.8 C) (Oral)   Ht 5\' 4"  (1.626 m)   Wt 209 lb 3.2 oz (94.9 kg)   SpO2 97%   BMI 35.91 kg/m   Wt Readings from Last 3 Encounters:  01/30/20 209 lb 3.2 oz (94.9 kg)  01/15/20 208 lb (94.3 kg)  09/14/19 199 lb 6.4 oz (90.4 kg)    Physical Exam Vitals and nursing note reviewed.  Constitutional:      General: She is not in acute distress.    Appearance: Normal appearance. She is not ill-appearing, toxic-appearing or diaphoretic.  HENT:     Head: Normocephalic and atraumatic.     Right Ear: External ear normal.     Left Ear: External ear normal.     Nose: Nose normal.     Mouth/Throat:     Mouth: Mucous membranes are moist.     Pharynx: Oropharynx is clear.  Eyes:     General: No scleral icterus.       Right eye: No discharge.        Left eye: No discharge.     Extraocular Movements: Extraocular movements intact.     Conjunctiva/sclera: Conjunctivae normal.     Pupils: Pupils are equal, round, and reactive to light.  Cardiovascular:     Rate and Rhythm: Normal rate and regular rhythm.     Pulses: Normal pulses.     Heart sounds: Normal heart sounds. No murmur heard.  No friction rub. No gallop.   Pulmonary:     Effort: Pulmonary effort is normal. No respiratory distress.     Breath sounds: Normal breath sounds. No stridor. No wheezing, rhonchi or  rales.  Chest:     Chest wall: No tenderness.  Musculoskeletal:        General: Normal range of motion.     Cervical back: Normal range of motion and neck supple.  Skin:    General: Skin is warm and dry.     Capillary Refill: Capillary refill takes less than 2 seconds.     Coloration: Skin is not jaundiced or pale.     Findings: No bruising, erythema, lesion or rash.  Neurological:     General: No focal deficit present.     Mental Status: She is alert and oriented to person, place, and time. Mental status is at baseline.  Psychiatric:        Mood and Affect: Mood normal.        Behavior: Behavior normal.        Thought Content: Thought content normal.        Judgment: Judgment normal.     Results for orders placed or performed in visit on 01/30/20  UA/M w/rflx Culture, Routine   Specimen: Urine   Urine  Result Value Ref Range   Specific Gravity, UA 1.020 1.005 - 1.030   pH, UA 6.5 5.0 - 7.5   Color, UA Yellow Yellow   Appearance Ur Clear Clear   Leukocytes,UA Negative Negative   Protein,UA Negative Negative/Trace   Glucose, UA Negative Negative   Ketones, UA Negative Negative   RBC, UA Negative Negative   Bilirubin, UA Negative Negative   Urobilinogen, Ur 0.2 0.2 - 1.0 mg/dL   Nitrite, UA Negative Negative      Assessment & Plan:   Problem List Items Addressed This Visit      Other   Anxiety    Slight hypersexuality, but it's not bothering her. No other manic symptoms. This is more similar to her usual libido- continue to monitor, if getting worse, let us know. Anxiety improving. Does not want to change medication at this time. Continue to monitor. Call with any concerns.       Chronic fatigue    Continues with a lot going on and very little sleep. To have cardiology work up shortly. Continue current anxiety regimen. Continue to monitor.        Other Visit Diagnoses    Acute bilateral low back pain without sciatica    -  Primary   Will start naproxen and  flexeril. Call if not getting better or getting worse. Conitnue to monitor.    Relevant Medications   naproxen (NAPROSYN) 500 MG tablet   cyclobenzaprine (FLEXERIL) 10 MG tablet   Other Relevant Orders   UA/M w/rflx Culture, Routine (Completed)   Difficulty urinating       Checking urine today, if continues will get her into see urology. Continue to monitor.        Follow up plan: Return 3-4 weeks.

## 2020-01-30 NOTE — Patient Instructions (Signed)

## 2020-02-04 DIAGNOSIS — R079 Chest pain, unspecified: Secondary | ICD-10-CM | POA: Diagnosis not present

## 2020-02-04 DIAGNOSIS — I10 Essential (primary) hypertension: Secondary | ICD-10-CM | POA: Diagnosis not present

## 2020-02-04 DIAGNOSIS — Z8249 Family history of ischemic heart disease and other diseases of the circulatory system: Secondary | ICD-10-CM | POA: Diagnosis not present

## 2020-02-05 NOTE — Assessment & Plan Note (Signed)
Slight hypersexuality, but it's not bothering her. No other manic symptoms. This is more similar to her usual libido- continue to monitor, if getting worse, let us know. Anxiety improving. Does not want to change medication at this time. Continue to monitor. Call with any concerns.

## 2020-02-05 NOTE — Assessment & Plan Note (Signed)
Continues with a lot going on and very little sleep. To have cardiology work up shortly. Continue current anxiety regimen. Continue to monitor.

## 2020-02-06 DIAGNOSIS — I10 Essential (primary) hypertension: Secondary | ICD-10-CM | POA: Diagnosis not present

## 2020-02-06 DIAGNOSIS — F419 Anxiety disorder, unspecified: Secondary | ICD-10-CM | POA: Diagnosis not present

## 2020-07-23 ENCOUNTER — Telehealth (INDEPENDENT_AMBULATORY_CARE_PROVIDER_SITE_OTHER): Payer: BC Managed Care – PPO | Admitting: Nurse Practitioner

## 2020-07-23 ENCOUNTER — Encounter: Payer: Self-pay | Admitting: Nurse Practitioner

## 2020-07-23 ENCOUNTER — Other Ambulatory Visit: Payer: Self-pay

## 2020-07-23 ENCOUNTER — Ambulatory Visit
Admission: RE | Admit: 2020-07-23 | Discharge: 2020-07-23 | Disposition: A | Discharge status: Home / Self Care | Payer: BC Managed Care – PPO | Source: Ambulatory Visit | Attending: Nurse Practitioner | Admitting: Nurse Practitioner

## 2020-07-23 ENCOUNTER — Ambulatory Visit
Admission: RE | Admit: 2020-07-23 | Discharge: 2020-07-23 | Disposition: A | Discharge status: Home / Self Care | Payer: BC Managed Care – PPO | Source: Home / Self Care | Attending: Nurse Practitioner | Admitting: Nurse Practitioner

## 2020-07-23 VITALS — Temp 97.6°F | Wt 188.0 lb

## 2020-07-23 DIAGNOSIS — R059 Cough, unspecified: Secondary | ICD-10-CM | POA: Insufficient documentation

## 2020-07-23 MED ORDER — PREDNISONE 20 MG PO TABS
40.0000 mg | ORAL_TABLET | Freq: Every day | ORAL | 0 refills | Status: AC
Start: 2020-07-23 — End: 2020-07-28

## 2020-07-23 MED ORDER — HYDROCOD POLST-CPM POLST ER 10-8 MG/5ML PO SUER: 5.0000 mL | Freq: Two times a day (BID) | ORAL | 0 refills | Status: DC | PRN

## 2020-07-23 MED ORDER — AMOXICILLIN-POT CLAVULANATE 875-125 MG PO TABS: 1.0000 | ORAL_TABLET | Freq: Two times a day (BID) | ORAL | 0 refills | Status: AC

## 2020-07-23 MED ORDER — ALBUTEROL SULFATE HFA 108 (90 BASE) MCG/ACT IN AERS
2.0000 | INHALATION_SPRAY | Freq: Four times a day (QID) | RESPIRATORY_TRACT | 0 refills | Status: DC | PRN
Start: 2020-07-23 — End: 2020-08-11

## 2020-07-23 NOTE — Patient Instructions (Signed)

## 2020-07-23 NOTE — Assessment & Plan Note (Signed)
History of Covid in January with recent ongoing cough for 4-5 weeks fluctuating in severity.  At this time will obtain CXR due to length of period with cough, concern for PNA.  Will send in Augmentin to start for 10 days course and if PNA on imaging plan on adding on Azithromycin -- discussed with patient.  Prednisone burst 40 MG x 5 days + Albuterol inhaler and Tussionex sent in to use as needed.  Recommend she get plenty of rest and fluids.  Cut back on work hours if possible.  If any worsening SOB or symptoms immediately notify provider or go to ER.  At this time return in 2 weeks for follow-up and lung check in office.

## 2020-07-23 NOTE — Progress Notes (Signed)
Temp 97.6 F (36.4 C)   Wt 188 lb (85.3 kg)   BMI 32.27 kg/m    Subjective:    Patient ID: Julia Jenkins, female    DOB: [birth year], 50 y.o.   MRN: [id number]  HPI: Julia Jenkins is a 50 y.o. female  Chief Complaint  Patient presents with  . URI    X 4 -5 weeks, was thinking it was the pollen. She states that every time she would start to be better she would get worse again. States that it is as bad as it has been now Body aches, cough, congestion, headache, sob. Had [place] in January     . This visit was completed via MyChart due to the restrictions of the COVID-19 pandemic. All issues as above were discussed and addressed. Physical exam was done as above through visual confirmation on MyChart. If it was felt that the patient should be evaluated in the office, they were directed there. The patient verbally consented to this visit. . Location of the patient: home . Location of the provider: work . Those involved with this call:  . Provider: Marnee Guarneri, DNP . CMA: Irena Reichmann, CMA . Front Desk/Registration: Jill Side  . Time spent on call: 21 minutes with patient face to face via video conference. More than 50% of this time was spent in counseling and coordination of care. 15 minutes total spent in review of patient's record and preparation of their chart.  . I verified patient identity using two factors (patient name and date of birth). Patient consents verbally to being seen via telemedicine visit today.    UPPER RESPIRATORY TRACT INFECTION Has been sick for 4-5 weeks, is working 90 hours a week for past 2 months -- can not find help and is working many hours.  Has history of Covid in January -- reports she felt better, but has had mild ongoing cough with this.  Currently has congestion in chest and nose.  Had sinus infection 2 weeks ago and recovered from this. Fever: none Cough: yes Shortness of breath: a little bit at night Wheezing: yes started yesterday Chest pain:  yes, with cough Chest tightness: yes Chest congestion: yes Nasal congestion: yes Runny nose: no Post nasal drip: no Sneezing: yes Sore throat: no Swollen glands: no Sinus pressure: no Headache: no Face pain: no Toothache: no Ear pain: none Ear pressure: none Eyes red/itching:no Eye drainage/crusting: no  Vomiting: no Rash: no Fatigue: yes Sick contacts: no Strep contacts: no  Context: fluctuating Recurrent sinusitis: no Relief with OTC cold/cough medications: no  Treatments attempted: mucinex   Relevant past medical, surgical, family and social history reviewed and updated as indicated. Interim medical history since our last visit reviewed. Allergies and medications reviewed and updated.  Review of Systems  Constitutional: Positive for chills and fatigue. Negative for activity change, appetite change and fever.  HENT: Positive for congestion. Negative for ear discharge, ear pain, facial swelling, postnasal drip, rhinorrhea, sinus pressure, sinus pain, sneezing, sore throat and voice change.   Eyes: Negative for pain and visual disturbance.  Respiratory: Positive for cough, chest tightness, shortness of breath and wheezing.   Cardiovascular: Negative for chest pain, palpitations and leg swelling.  Gastrointestinal: Negative.   Endocrine: Negative.   Musculoskeletal: Positive for myalgias.  Neurological: Negative for dizziness, numbness and headaches.  Psychiatric/Behavioral: Negative.     Per HPI unless specifically indicated above     Objective:    Temp 97.6 F (36.4 C)   Wt 188  lb (85.3 kg)   BMI 32.27 kg/m   Wt Readings from Last 3 Encounters:  07/23/20 188 lb (85.3 kg)  01/30/20 209 lb 3.2 oz (94.9 kg)  01/15/20 208 lb (94.3 kg)    Physical Exam Vitals and nursing note reviewed.  Constitutional:      General: She is awake. She is not in acute distress.    Appearance: She is well-developed and well-groomed. She is ill-appearing. She is not  toxic-appearing.  HENT:     Head: Normocephalic.     Right Ear: Hearing normal.     Left Ear: Hearing normal.  Eyes:     General: Lids are normal.        Right eye: No discharge.        Left eye: No discharge.     Conjunctiva/sclera: Conjunctivae normal.  Pulmonary:     Effort: Pulmonary effort is normal. No accessory muscle usage or respiratory distress.     Comments: Talkative without SOB.  Intermittent hoarse cough present. Musculoskeletal:     Cervical back: Normal range of motion.  Neurological:     Mental Status: She is alert and oriented to person, place, and time.  Psychiatric:        Attention and Perception: Attention normal.        Mood and Affect: Mood normal.        Behavior: Behavior normal. Behavior is cooperative.        Thought Content: Thought content normal.        Judgment: Judgment normal.    Results for orders placed or performed in visit on 01/30/20  UA/M w/rflx Culture, Routine   Specimen: Urine   Urine  Result Value Ref Range   Specific Gravity, UA 1.020 1.005 - 1.030   pH, UA 6.5 5.0 - 7.5   Color, UA Yellow Yellow   Appearance Ur Clear Clear   Leukocytes,UA Negative Negative   Protein,UA Negative Negative/Trace   Glucose, UA Negative Negative   Ketones, UA Negative Negative   RBC, UA Negative Negative   Bilirubin, UA Negative Negative   Urobilinogen, Ur 0.2 0.2 - 1.0 mg/dL   Nitrite, UA Negative Negative      Assessment & Plan:   Problem List Items Addressed This Visit      Other   Cough - Primary    History of Covid in January with recent ongoing cough for 4-5 weeks fluctuating in severity.  At this time will obtain CXR due to length of period with cough, concern for PNA.  Will send in Augmentin to start for 10 days course and if PNA on imaging plan on adding on Azithromycin -- discussed with patient.  Prednisone burst 40 MG x 5 days + Albuterol inhaler and Tussionex sent in to use as needed.  Recommend she get plenty of rest and fluids.   Cut back on work hours if possible.  If any worsening SOB or symptoms immediately notify provider or go to ER.  At this time return in 2 weeks for follow-up and lung check in office.      Relevant Orders   DG Chest 2 View      I discussed the assessment and treatment plan with the patient. The patient was provided an opportunity to ask questions and all were answered. The patient agreed with the plan and demonstrated an understanding of the instructions.   The patient was advised to call back or seek an in-person evaluation if the symptoms worsen or if the  condition fails to improve as anticipated.   I provided 21+ minutes of time during this encounter.  Follow up plan: Return in about 2 weeks (around 08/06/2020) for Cough.

## 2020-07-25 NOTE — Progress Notes (Signed)
Contacted via [place] afternoon Julia Jenkins, no pneumonia was noted on chest x-ray on review of images and report.  Great news!!  At this time continue current medications and scheduled follow-up.  Any questions? Keep being awesome!!  Thank you for allowing me to participate in your care. Kindest regards, Jazline Cumbee

## 2020-08-11 ENCOUNTER — Other Ambulatory Visit: Payer: Self-pay

## 2020-08-11 ENCOUNTER — Ambulatory Visit: Payer: BC Managed Care – PPO | Admitting: Family Medicine

## 2020-08-11 ENCOUNTER — Encounter: Payer: Self-pay | Admitting: Family Medicine

## 2020-08-11 VITALS — BP 139/83 | HR 74 | Temp 98.9°F | Wt 198.4 lb

## 2020-08-11 DIAGNOSIS — R059 Cough, unspecified: Secondary | ICD-10-CM

## 2020-08-11 DIAGNOSIS — H579 Unspecified disorder of eye and adnexa: Secondary | ICD-10-CM

## 2020-08-11 NOTE — Progress Notes (Signed)
BP 139/83   Pulse 74   Temp 98.9 F (37.2 C)   Wt 198 lb 6.4 oz (90 kg)   SpO2 100%   BMI 34.06 kg/m    Subjective:    Patient ID: Julia Jenkins, female    DOB: 1971-03-17, 50 y.o.   MRN: [id number]  HPI: Julia Jenkins is a 50 y.o. female  Chief Complaint  Patient presents with  . Cough    Follow up, patient states she is doing better   . Eye Problem    Patient states a couple weeks ago she noticed a bump on her right eyeball. Bump grows and decreases in size.    Has been feeling much better since taking the antibiotics. She has not been having and fevers, no chills, no coughs. She is otherwise feeling well.   She has been noticing a spot on her R eye for the past couple of weeks, feels like it is a small bump on the medial portion of her R eye. It is mildly red, not painful. She has called her eye doctor but can't get in for over a month.   Relevant past medical, surgical, family and social history reviewed and updated as indicated. Interim medical history since our last visit reviewed. Allergies and medications reviewed and updated.  Review of Systems  Constitutional: Negative.   HENT: Negative.   Respiratory: Negative.   Cardiovascular: Negative.   Gastrointestinal: Negative.   Musculoskeletal: Negative.   Psychiatric/Behavioral: Negative.     Per HPI unless specifically indicated above     Objective:    BP 139/83   Pulse 74   Temp 98.9 F (37.2 C)   Wt 198 lb 6.4 oz (90 kg)   SpO2 100%   BMI 34.06 kg/m   Wt Readings from Last 3 Encounters:  08/11/20 198 lb 6.4 oz (90 kg)  07/23/20 188 lb (85.3 kg)  01/30/20 209 lb 3.2 oz (94.9 kg)    Physical Exam Vitals and nursing note reviewed.  Constitutional:      General: She is not in acute distress.    Appearance: Normal appearance. She is not ill-appearing, toxic-appearing or diaphoretic.  HENT:     Head: Normocephalic and atraumatic.     Right Ear: External ear normal.     Left Ear: External ear normal.      Nose: Nose normal.     Mouth/Throat:     Mouth: Mucous membranes are moist.     Pharynx: Oropharynx is clear.  Eyes:     General: No scleral icterus.       Right eye: No discharge.        Left eye: No discharge.     Extraocular Movements: Extraocular movements intact.     Conjunctiva/sclera: Conjunctivae normal.     Pupils: Pupils are equal, round, and reactive to light.  Cardiovascular:     Rate and Rhythm: Normal rate and regular rhythm.     Pulses: Normal pulses.     Heart sounds: Normal heart sounds. No murmur heard. No friction rub. No gallop.   Pulmonary:     Effort: Pulmonary effort is normal. No respiratory distress.     Breath sounds: Normal breath sounds. No stridor. No wheezing, rhonchi or rales.  Chest:     Chest wall: No tenderness.  Musculoskeletal:        General: Normal range of motion.     Cervical back: Normal range of motion and neck supple.  Skin:    General:  Skin is warm and dry.     Capillary Refill: Capillary refill takes less than 2 seconds.     Coloration: Skin is not jaundiced or pale.     Findings: No bruising, erythema, lesion or rash.  Neurological:     General: No focal deficit present.     Mental Status: She is alert and oriented to person, place, and time. Mental status is at baseline.  Psychiatric:        Mood and Affect: Mood normal.        Behavior: Behavior normal.        Thought Content: Thought content normal.        Judgment: Judgment normal.     Results for orders placed or performed in visit on 01/30/20  UA/M w/rflx Culture, Routine   Specimen: Urine   Urine  Result Value Ref Range   Specific Gravity, UA 1.020 1.005 - 1.030   pH, UA 6.5 5.0 - 7.5   Color, UA Yellow Yellow   Appearance Ur Clear Clear   Leukocytes,UA Negative Negative   Protein,UA Negative Negative/Trace   Glucose, UA Negative Negative   Ketones, UA Negative Negative   RBC, UA Negative Negative   Bilirubin, UA Negative Negative   Urobilinogen, Ur 0.2 0.2 -  1.0 mg/dL   Nitrite, UA Negative Negative      Assessment & Plan:   Problem List Items Addressed This Visit      Other   RESOLVED: Cough    Resolved. Feeling better. No other concerns or complaints. Continue to monitor.        Other Visit Diagnoses    Eye problem    -  Primary   Of unclear etiology- we will get her in with her eye doctor. Call with any concerns. Conitnue to monitor.        Follow up plan: Return for September- physical.

## 2020-08-11 NOTE — Assessment & Plan Note (Signed)
Resolved. Feeling better. No other concerns or complaints. Continue to monitor.

## 2020-09-18 ENCOUNTER — Other Ambulatory Visit: Payer: Self-pay

## 2020-09-18 DIAGNOSIS — Z1231 Encounter for screening mammogram for malignant neoplasm of breast: Secondary | ICD-10-CM

## 2020-11-25 ENCOUNTER — Encounter: Payer: BC Managed Care – PPO | Admitting: Dermatology

## 2020-12-29 ENCOUNTER — Ambulatory Visit
Admission: RE | Admit: 2020-12-29 | Discharge: 2020-12-29 | Disposition: A | Discharge status: Home / Self Care | Payer: BC Managed Care – PPO | Source: Ambulatory Visit | Attending: Nurse Practitioner | Admitting: Nurse Practitioner

## 2020-12-29 ENCOUNTER — Encounter: Payer: Self-pay | Admitting: Nurse Practitioner

## 2020-12-29 ENCOUNTER — Other Ambulatory Visit: Payer: Self-pay

## 2020-12-29 ENCOUNTER — Ambulatory Visit: Payer: BC Managed Care – PPO | Admitting: Nurse Practitioner

## 2020-12-29 ENCOUNTER — Ambulatory Visit: Payer: Self-pay | Admitting: *Deleted

## 2020-12-29 ENCOUNTER — Ambulatory Visit
Admission: RE | Admit: 2020-12-29 | Discharge: 2020-12-29 | Disposition: A | Discharge status: Home / Self Care | Payer: BC Managed Care – PPO | Attending: Nurse Practitioner | Admitting: Nurse Practitioner

## 2020-12-29 VITALS — BP 156/94 | HR 63 | Temp 98.2°F | Ht 63.7 in | Wt 206.2 lb

## 2020-12-29 DIAGNOSIS — M25561 Pain in right knee: Secondary | ICD-10-CM

## 2020-12-29 MED ORDER — PREDNISONE 10 MG PO TABS
10.0000 mg | ORAL_TABLET | Freq: Every day | ORAL | 0 refills | Status: DC
Start: 2020-12-29 — End: 2021-02-17

## 2020-12-29 NOTE — Telephone Encounter (Signed)
Reason for Disposition . [1] MODERATE pain (e.g., interferes with normal activities, limping) AND [2] present > 3 days  Answer Assessment - Initial Assessment Questions 1. LOCATION and RADIATION: "Where is the pain located?"      Right knee behind the knee cap 2. QUALITY: "What does the pain feel like?"  (e.g., sharp, dull, aching, burning)     swelling 3. SEVERITY: "How bad is the pain?" "What does it keep you from doing?"   (Scale 1-10; or mild, moderate, severe)   -  MILD (1-3): doesn't interfere with normal activities    -  MODERATE (4-7): interferes with normal activities (e.g., work or school) or awakens from sleep, limping    -  SEVERE (8-10): excruciating pain, unable to do any normal activities, unable to walk     5 4. ONSET: "When did the pain start?" "Does it come and go, or is it there all the time?"     On/off for two weeks 5. RECURRENT: "Have you had this pain before?" If Yes, ask: "When, and what happened then?"     no 6. SETTING: "Has there been any recent work, exercise or other activity that involved that part of the body?"      90 hour work week lately  7. AGGRAVATING FACTORS: "What makes the knee pain worse?" (e.g., walking, climbing stairs, running)     Walking worsens 8. ASSOCIATED SYMPTOMS: "Is there any swelling or redness of the knee?"     None visible  9. OTHER SYMPTOMS: "Do you have any other symptoms?" (e.g., chest pain, difficulty breathing, fever, calf pain)     no 10. PREGNANCY: "Is there any chance you are pregnant?" "When was your last menstrual period?"       na  Protocols used: Knee Pain-A-AH

## 2020-12-29 NOTE — Progress Notes (Signed)
BP (!) 156/94   Pulse 63   Temp 98.2 F (36.8 C)   Ht 5' 3.7" (1.618 m)   Wt 206 lb 4 oz (93.6 kg)   SpO2 96%   BMI 35.74 kg/m    Subjective:    Patient ID: Julia Jenkins, female    DOB: 13-Feb-1971, 50 y.o.   MRN: [id number]  HPI: Julia Jenkins is a 50 y.o. female  Chief Complaint  Patient presents with   Knee Pain    Right, started last week, Saturday was unbearable no known injury    KNEE PAIN Duration: days Involved knee: right Mechanism of injury: unknown Location:medial Onset: sudden Severity: moderate  Quality:   can't describe Frequency: constant Radiation: no Aggravating factors:  sleeping, weight bearing, walking, stairs, and bending  Alleviating factors:  naproxen, Ice, and rest   Status: worse Treatments attempted:  naproxen, rest, and ice  Relief with NSAIDs?:  no Weakness with weight bearing or walking: no Sensation of giving way: no Locking: no Popping: no Bruising: no Swelling: yes Redness: no Paresthesias/decreased sensation: no Fevers: no  Relevant past medical, surgical, family and social history reviewed and updated as indicated. Interim medical history since our last visit reviewed. Allergies and medications reviewed and updated.  Review of Systems  Skin:        Right knee pain   Per HPI unless specifically indicated above     Objective:    BP (!) 156/94   Pulse 63   Temp 98.2 F (36.8 C)   Ht 5' 3.7" (1.618 m)   Wt 206 lb 4 oz (93.6 kg)   SpO2 96%   BMI 35.74 kg/m   Wt Readings from Last 3 Encounters:  12/29/20 206 lb 4 oz (93.6 kg)  08/11/20 198 lb 6.4 oz (90 kg)  07/23/20 188 lb (85.3 kg)    Physical Exam Vitals and nursing note reviewed.  Constitutional:      General: She is not in acute distress.    Appearance: Normal appearance. She is normal weight. She is not ill-appearing, toxic-appearing or diaphoretic.  HENT:     Head: Normocephalic.     Right Ear: External ear normal.     Left Ear: External ear normal.      Nose: Nose normal.     Mouth/Throat:     Mouth: Mucous membranes are moist.     Pharynx: Oropharynx is clear.  Eyes:     General:        Right eye: No discharge.        Left eye: No discharge.     Extraocular Movements: Extraocular movements intact.     Conjunctiva/sclera: Conjunctivae normal.     Pupils: Pupils are equal, round, and reactive to light.  Cardiovascular:     Rate and Rhythm: Normal rate and regular rhythm.     Heart sounds: No murmur heard. Pulmonary:     Effort: Pulmonary effort is normal. No respiratory distress.     Breath sounds: Normal breath sounds. No wheezing or rales.  Musculoskeletal:     Cervical back: Normal range of motion and neck supple.     Right knee: Swelling present. Decreased range of motion. No tenderness. No LCL laxity, MCL laxity or ACL laxity.  Skin:    General: Skin is warm and dry.     Capillary Refill: Capillary refill takes less than 2 seconds.  Neurological:     General: No focal deficit present.     Mental Status: She is  alert and oriented to person, place, and time. Mental status is at baseline.  Psychiatric:        Mood and Affect: Mood normal.        Behavior: Behavior normal.        Thought Content: Thought content normal.        Judgment: Judgment normal.    Results for orders placed or performed in visit on 01/30/20  UA/M w/rflx Culture, Routine   Specimen: Urine   Urine  Result Value Ref Range   Specific Gravity, UA 1.020 1.005 - 1.030   pH, UA 6.5 5.0 - 7.5   Color, UA Yellow Yellow   Appearance Ur Clear Clear   Leukocytes,UA Negative Negative   Protein,UA Negative Negative/Trace   Glucose, UA Negative Negative   Ketones, UA Negative Negative   RBC, UA Negative Negative   Bilirubin, UA Negative Negative   Urobilinogen, Ur 0.2 0.2 - 1.0 mg/dL   Nitrite, UA Negative Negative      Assessment & Plan:   Problem List Items Addressed This Visit   None Visit Diagnoses     Acute pain of right knee    -  Primary    Obtain xray of right knee. Prednisone sent for patient during visit today. DIscussed proper use.  Will make further recommendations based on imaging results.   Relevant Orders   DG Knee Complete 4 Views Right        Follow up plan: Return if symptoms worsen or fail to improve.

## 2020-12-30 NOTE — Addendum Note (Signed)
Addended by: Jon Billings on: 12/30/2020 08:52 AM   Modules accepted: Orders

## 2020-12-30 NOTE — Progress Notes (Signed)
Please let patient know her xray did not show any abnormality but it did so some swelling in the joint.  I recommend she see Orthopedics.  I have placed the referral.

## 2020-12-31 DIAGNOSIS — M13861 Other specified arthritis, right knee: Secondary | ICD-10-CM | POA: Diagnosis not present

## 2021-01-18 ENCOUNTER — Other Ambulatory Visit: Payer: Self-pay | Admitting: Family Medicine

## 2021-01-19 NOTE — Telephone Encounter (Signed)
Requested medication (s) are due for refill today Yes  Requested medication (s) are on the active medication list Yes  Future visit scheduled 02/17/21  Note to office-Labs do not meet protocol.    Requested Prescriptions  Pending Prescriptions Disp Refills   naproxen (NAPROSYN) 500 MG tablet [Pharmacy Med Name: NAPROXEN '500MG'$  TABLETS] 60 tablet     Sig: TAKE 1 TABLET(500 MG) BY MOUTH TWICE DAILY WITH A MEAL     Analgesics:  NSAIDS Failed - 01/18/2021  3:17 AM      Failed - Cr in normal range and within 360 days    Creatinine, Ser  Date Value Ref Range Status  09/14/2019 0.73 0.57 - 1.00 mg/dL Final          Failed - HGB in normal range and within 360 days    Hemoglobin  Date Value Ref Range Status  09/14/2019 14.6 11.1 - 15.9 g/dL Final          Passed - Patient is not pregnant      Passed - Valid encounter within last 12 months    Recent Outpatient Visits           3 weeks ago Acute pain of right knee   [facility] Jon Billings, NP   5 months ago Eye problem   [employer], [place], DO   6 months ago Cough   [place], Armona T, NP   11 months ago Acute bilateral low back pain without sciatica   [place], DO   1 year ago Chest pain, unspecified type   [place], Barb Merino, DO       Future Appointments             In 4 weeks Wynetta Emery, Barb Merino, DO [employer], [facility]

## 2021-02-17 ENCOUNTER — Other Ambulatory Visit: Payer: Self-pay

## 2021-02-17 ENCOUNTER — Encounter: Payer: Self-pay | Admitting: Family Medicine

## 2021-02-17 ENCOUNTER — Ambulatory Visit: Payer: BC Managed Care – PPO | Admitting: Family Medicine

## 2021-02-17 ENCOUNTER — Other Ambulatory Visit (HOSPITAL_COMMUNITY)
Admission: RE | Admit: 2021-02-17 | Discharge: 2021-02-17 | Disposition: A | Discharge status: Home / Self Care | Payer: BC Managed Care – PPO | Source: Ambulatory Visit | Attending: Family Medicine | Admitting: Family Medicine

## 2021-02-17 VITALS — BP 137/89 | HR 69 | Ht 63.0 in | Wt 206.0 lb

## 2021-02-17 DIAGNOSIS — Z0000 Encounter for general adult medical examination without abnormal findings: Secondary | ICD-10-CM

## 2021-02-17 DIAGNOSIS — I10 Essential (primary) hypertension: Secondary | ICD-10-CM | POA: Diagnosis not present

## 2021-02-17 DIAGNOSIS — Z136 Encounter for screening for cardiovascular disorders: Secondary | ICD-10-CM | POA: Diagnosis not present

## 2021-02-17 DIAGNOSIS — Z1211 Encounter for screening for malignant neoplasm of colon: Secondary | ICD-10-CM

## 2021-02-17 LAB — MICROSCOPIC EXAMINATION
Bacteria, UA: NONE SEEN
RBC, Urine: NONE SEEN /hpf (ref 0–2)

## 2021-02-17 LAB — URINALYSIS, ROUTINE W REFLEX MICROSCOPIC
Bilirubin, UA: NEGATIVE
Glucose, UA: NEGATIVE
Ketones, UA: NEGATIVE
Nitrite, UA: NEGATIVE
Protein,UA: NEGATIVE
RBC, UA: NEGATIVE
Specific Gravity, UA: 1.02 (ref 1.005–1.030)
Urobilinogen, Ur: 0.2 mg/dL (ref 0.2–1.0)
pH, UA: 6.5 (ref 5.0–7.5)

## 2021-02-17 LAB — MICROALBUMIN, URINE WAIVED
Creatinine, Urine Waived: 50 mg/dL (ref 10–300)
Microalb, Ur Waived: 10 mg/L (ref 0–19)
Microalb/Creat Ratio: <30 mg/g (ref <30)

## 2021-02-17 NOTE — Progress Notes (Signed)
BP 137/89   Pulse 69   Ht 5\' 3"  (1.6 m)   Wt 206 lb (93.4 kg)   BMI 36.49 kg/m    Subjective:    Patient ID: Julia Jenkins, female    DOB: [birth year], 50 y.o.   MRN: [id number]  HPI: Julia Jenkins is a 50 y.o. female presenting on 02/17/2021 for comprehensive medical examination. Current medical complaints include:  HYPERTENSION Hypertension status: controlled  Satisfied with current treatment? yes Duration of hypertension: chronic BP monitoring frequency:  not checking BP medication side effects:  not on anything Medication compliance: excellent compliance Aspirin: no Recurrent headaches: no Visual changes: no Palpitations: no Dyspnea: no Chest pain: no Lower extremity edema: no Dizzy/lightheaded: no  Menopausal Symptoms: yes  Depression Screen done today and results listed below:  Depression screen [facility] 2/9 02/17/2021 12/29/2020 01/30/2020 01/15/2020 09/14/2019  Decreased Interest 0 0 0 3 1  Down, Depressed, Hopeless 0 0 0 0 0  PHQ - 2 Score 0 0 0 3 1  Altered sleeping 0 - 2 0 0  Tired, decreased energy 0 - 0 3 2  Change in appetite 0 - 0 0 0  Feeling bad or failure about yourself  0 - 0 0 0  Trouble concentrating 0 - 1 0 0  Moving slowly or fidgety/restless 0 - 0 0 0  Suicidal thoughts 0 - 0 0 0  PHQ-9 Score 0 - 3 6 3   Difficult doing work/chores - - Not difficult at all Somewhat difficult Not difficult at all     Past Medical History:  Past Medical History:  Diagnosis Date   Hypertension    Lipoma of neck     Surgical History:  Past Surgical History:  Procedure Laterality Date   CESAREAN SECTION     LIPOMA EXCISION     Neck   TUBAL LIGATION     WISDOM TOOTH EXTRACTION      Medications:  Current Outpatient Medications on File Prior to Visit  Medication Sig   APPLE CIDER VINEGAR PO Take by mouth.   Ascorbic Acid (VITAMIN C PO) Take by mouth.   ASHWAGANDHA PO Take by mouth.   Cholecalciferol (VITAMIN D3 PO) Take by mouth.   Maca Root (MACA PO) Take by  mouth.   Omega-3 Fatty Acids (OMEGA-3 FISH OIL PO) Take by mouth.   TURMERIC PO Take by mouth.   No current facility-administered medications on file prior to visit.    Allergies:  Allergies  Allergen Reactions   Cymbalta [Duloxetine Hcl] Other (See Comments)    Felt strange    Social History:  Social History   Socioeconomic History   Marital status: Married    Spouse name: Not on file   Number of children: Not on file   Years of education: Not on file   Highest education level: Not on file  Occupational History   Not on file  Tobacco Use   Smoking status: Former    Types: E-cigarettes    Quit date: 2010    Years since quitting: 12.8   Smokeless tobacco: Never  Vaping Use   Vaping Use: Never used  Substance and Sexual Activity   Alcohol use: Not Currently   Drug use: Never   Sexual activity: Yes    Birth control/protection: None  Other Topics Concern   Not on file  Social History Narrative   Not on file   Social Determinants of Health   Financial Resource Strain: Not on file  Food Insecurity: Not  on file  Transportation Needs: Not on file  Physical Activity: Not on file  Stress: Not on file  Social Connections: Not on file  Intimate Partner Violence: Not on file   Social History   Tobacco Use  Smoking Status Former   Types: E-cigarettes   Quit date: 2010   Years since quitting: 12.8  Smokeless Tobacco Never   Social History   Substance and Sexual Activity  Alcohol Use Not Currently    Family History:  Family History  Problem Relation Age of Onset   Heart disease Maternal Grandmother    Hypertension Maternal Grandmother    Heart disease Maternal Grandfather    Hypertension Maternal Grandfather    Heart disease Paternal Grandmother    Hypertension Paternal Grandmother    Heart disease Paternal Grandfather    Heart attack Paternal Grandfather    Hypertension Paternal Grandfather    Hyperlipidemia Mother    Heart disease Father    Heart  attack Father    Prostate cancer Father    Hypertension Father    Drug abuse Sister    Depression Sister    COPD Sister    Drug abuse Sister    Schizophrenia Sister    Personality disorder Sister     Past medical history, surgical history, medications, allergies, family history and social history reviewed with patient today and changes made to appropriate areas of the chart.   Review of Systems  Constitutional:  Positive for diaphoresis. Negative for chills, fever, malaise/fatigue and weight loss.  HENT: Negative.    Eyes: Negative.   Respiratory: Negative.    Cardiovascular: Negative.   Gastrointestinal: Negative.   Genitourinary: Negative.   Musculoskeletal: Negative.   Skin: Negative.   Neurological:  Positive for speech change. Negative for dizziness, tingling, tremors, sensory change, focal weakness, seizures, loss of consciousness, weakness and headaches.  Endo/Heme/Allergies: Negative.   Psychiatric/Behavioral: Negative.    All other ROS negative except what is listed above and in the HPI.      Objective:    BP 137/89   Pulse 69   Ht 5\' 3"  (1.6 m)   Wt 206 lb (93.4 kg)   BMI 36.49 kg/m   Wt Readings from Last 3 Encounters:  02/17/21 206 lb (93.4 kg)  12/29/20 206 lb 4 oz (93.6 kg)  08/11/20 198 lb 6.4 oz (90 kg)    Physical Exam Vitals and nursing note reviewed. Exam conducted with a chaperone present.  Constitutional:      General: She is not in acute distress.    Appearance: Normal appearance. She is not ill-appearing, toxic-appearing or diaphoretic.  HENT:     Head: Normocephalic and atraumatic.     Right Ear: Tympanic membrane, ear canal and external ear normal. There is no impacted cerumen.     Left Ear: Tympanic membrane, ear canal and external ear normal. There is no impacted cerumen.     Nose: Nose normal. No congestion or rhinorrhea.     Mouth/Throat:     Mouth: Mucous membranes are moist.     Pharynx: Oropharynx is clear. No oropharyngeal exudate  or posterior oropharyngeal erythema.  Eyes:     General: No scleral icterus.       Right eye: No discharge.        Left eye: No discharge.     Extraocular Movements: Extraocular movements intact.     Conjunctiva/sclera: Conjunctivae normal.     Pupils: Pupils are equal, round, and reactive to light.  Neck:  Vascular: No carotid bruit.  Cardiovascular:     Rate and Rhythm: Normal rate and regular rhythm.     Pulses: Normal pulses.     Heart sounds: No murmur heard.   No friction rub. No gallop.  Pulmonary:     Effort: Pulmonary effort is normal. No respiratory distress.     Breath sounds: Normal breath sounds. No stridor. No wheezing, rhonchi or rales.  Chest:     Chest wall: No tenderness.  Abdominal:     General: Abdomen is flat. Bowel sounds are normal. There is no distension.     Palpations: Abdomen is soft. There is no mass.     Tenderness: There is no abdominal tenderness. There is no right CVA tenderness, left CVA tenderness, guarding or rebound.     Hernia: No hernia is present.  Genitourinary:    Labia:        Right: No rash, tenderness, lesion or injury.        Left: No rash, tenderness, lesion or injury.      Urethra: No prolapse, urethral pain, urethral swelling or urethral lesion.     Vagina: Normal.     Cervix: Normal.     Uterus: Normal.      Adnexa: Right adnexa normal and left adnexa normal.     Comments: Breast deferred with shared decision making Musculoskeletal:        General: No swelling, tenderness, deformity or signs of injury.     Cervical back: Normal range of motion and neck supple. No rigidity. No muscular tenderness.     Right lower leg: No edema.     Left lower leg: No edema.  Lymphadenopathy:     Cervical: No cervical adenopathy.  Skin:    General: Skin is warm and dry.     Capillary Refill: Capillary refill takes less than 2 seconds.     Coloration: Skin is not jaundiced or pale.     Findings: No bruising, erythema, lesion or rash.   Neurological:     General: No focal deficit present.     Mental Status: She is alert and oriented to person, place, and time. Mental status is at baseline.     Cranial Nerves: No cranial nerve deficit.     Sensory: No sensory deficit.     Motor: No weakness.     Coordination: Coordination normal.     Gait: Gait normal.     Deep Tendon Reflexes: Reflexes normal.  Psychiatric:        Mood and Affect: Mood normal.        Behavior: Behavior normal.        Thought Content: Thought content normal.        Judgment: Judgment normal.    Results for orders placed or performed in visit on 01/30/20  UA/M w/rflx Culture, Routine   Specimen: Urine   Urine  Result Value Ref Range   Specific Gravity, UA 1.020 1.005 - 1.030   pH, UA 6.5 5.0 - 7.5   Color, UA Yellow Yellow   Appearance Ur Clear Clear   Leukocytes,UA Negative Negative   Protein,UA Negative Negative/Trace   Glucose, UA Negative Negative   Ketones, UA Negative Negative   RBC, UA Negative Negative   Bilirubin, UA Negative Negative   Urobilinogen, Ur 0.2 0.2 - 1.0 mg/dL   Nitrite, UA Negative Negative      Assessment & Plan:   Problem List Items Addressed This Visit       Cardiovascular  and Mediastinum   Essential hypertension    Doing well off medicine. Continue diet and exercise. Continue to monitor. Call with any concerns.       Relevant Orders   Microalbumin, Urine Waived   Other Visit Diagnoses     Routine general medical examination at a health care facility    -  Primary   Vaccines declined. Screening labs checked today. Pap done. Mammo and cologuard ordered today. Con   Relevant Orders   CBC with Differential/Platelet   Comprehensive metabolic panel   Lipid Panel w/o Chol/HDL Ratio   Cytology - PAP   Urinalysis, Routine w reflex microscopic   TSH   Microalbumin, Urine Waived   Screening for colon cancer       Cologuard ordered today.   Relevant Orders   Cologuard        Follow up plan: Return  in about 1 year (around 02/17/2022), or physical.   LABORATORY TESTING:  - Pap smear: pap done  IMMUNIZATIONS:   - Tdap: Tetanus vaccination status reviewed: Refused. - Influenza: Refused - Pneumovax: Not applicable - Prevnar: Not applicable - COVID: Refused  SCREENING: -Mammogram:  Encouraged to go get it   - Colonoscopy: cologuard ordered today   PATIENT COUNSELING:   Advised to take 1 mg of folate supplement per day if capable of pregnancy.   Sexuality: Discussed sexually transmitted diseases, partner selection, use of condoms, avoidance of unintended pregnancy  and contraceptive alternatives.   Advised to avoid cigarette smoking.  I discussed with the patient that most people either abstain from alcohol or drink within safe limits (<=14/week and <=4 drinks/occasion for males, <=7/weeks and <= 3 drinks/occasion for females) and that the risk for alcohol disorders and other health effects rises proportionally with the number of drinks per week and how often a drinker exceeds daily limits.  Discussed cessation/primary prevention of drug use and availability of treatment for abuse.   Diet: Encouraged to adjust caloric intake to maintain  or achieve ideal body weight, to reduce intake of dietary saturated fat and total fat, to limit sodium intake by avoiding high sodium foods and not adding table salt, and to maintain adequate dietary potassium and calcium preferably from fresh fruits, vegetables, and low-fat dairy products.    stressed the importance of regular exercise  Injury prevention: Discussed safety belts, safety helmets, smoke detector, smoking near bedding or upholstery.   Dental health: Discussed importance of regular tooth brushing, flossing, and dental visits.    NEXT PREVENTATIVE PHYSICAL DUE IN 1 YEAR. Return in about 1 year (around 02/17/2022), or physical.

## 2021-02-17 NOTE — Assessment & Plan Note (Signed)
Doing well off medicine. Continue diet and exercise. Continue to monitor. Call with any concerns.

## 2021-02-17 NOTE — Patient Instructions (Addendum)
Call to schedule your mammogram: [facility]  Address: [address], [place], [place] [address]  Phone: [phone]  

## 2021-02-18 LAB — COMPREHENSIVE METABOLIC PANEL
ALT: 19 IU/L (ref 0–32)
AST: 14 IU/L (ref 0–40)
Albumin/Globulin Ratio: 2.1 (ref 1.2–2.2)
Albumin: 4.7 g/dL (ref 3.8–4.9)
Alkaline Phosphatase: 113 IU/L (ref 44–121)
BUN/Creatinine Ratio: 17 (ref 9–23)
BUN: 11 mg/dL (ref 6–24)
Bilirubin Total: 0.3 mg/dL (ref 0.0–1.2)
CO2: 24 mmol/L (ref 20–29)
Calcium: 9.8 mg/dL (ref 8.7–10.2)
Chloride: 103 mmol/L (ref 96–106)
Creatinine, Ser: 0.65 mg/dL (ref 0.57–1.00)
Globulin, Total: 2.2 g/dL (ref 1.5–4.5)
Glucose: 87 mg/dL (ref 70–99)
Potassium: 4.2 mmol/L (ref 3.5–5.2)
Sodium: 143 mmol/L (ref 134–144)
Total Protein: 6.9 g/dL (ref 6.0–8.5)
eGFR: 106 mL/min/{1.73_m2} (ref >59)

## 2021-02-18 LAB — CBC WITH DIFFERENTIAL/PLATELET
Basophils Absolute: 0.1 10*3/uL (ref 0.0–0.2)
Basos: 1 %
EOS (ABSOLUTE): 0.5 10*3/uL — ABNORMAL HIGH (ref 0.0–0.4)
Eos: 9 %
Hematocrit: 42.6 % (ref 34.0–46.6)
Hemoglobin: 14.1 g/dL (ref 11.1–15.9)
Immature Grans (Abs): 0 10*3/uL (ref 0.0–0.1)
Immature Granulocytes: 0 %
Lymphocytes Absolute: 1.2 10*3/uL (ref 0.7–3.1)
Lymphs: 22 %
MCH: 28.4 pg (ref 26.6–33.0)
MCHC: 33.1 g/dL (ref 31.5–35.7)
MCV: 86 fL (ref 79–97)
Monocytes Absolute: 0.6 10*3/uL (ref 0.1–0.9)
Monocytes: 11 %
Neutrophils Absolute: 3 10*3/uL (ref 1.4–7.0)
Neutrophils: 57 %
Platelets: 254 10*3/uL (ref 150–450)
RBC: 4.96 x10E6/uL (ref 3.77–5.28)
RDW: 12.4 % (ref 11.7–15.4)
WBC: 5.2 10*3/uL (ref 3.4–10.8)

## 2021-02-18 LAB — LIPID PANEL W/O CHOL/HDL RATIO
Cholesterol, Total: 223 mg/dL — ABNORMAL HIGH (ref 100–199)
HDL: 54 mg/dL (ref >39)
LDL Chol Calc (NIH): 151 mg/dL — ABNORMAL HIGH (ref 0–99)
Triglycerides: 102 mg/dL (ref 0–149)
VLDL Cholesterol Cal: 18 mg/dL (ref 5–40)

## 2021-02-18 LAB — TSH: TSH: 8.15 u[IU]/mL — ABNORMAL HIGH (ref 0.450–4.500)

## 2021-02-20 LAB — CYTOLOGY - PAP
Comment: NEGATIVE
Diagnosis: NEGATIVE
Diagnosis: REACTIVE
High risk HPV: NEGATIVE

## 2021-02-25 DIAGNOSIS — Z1211 Encounter for screening for malignant neoplasm of colon: Secondary | ICD-10-CM | POA: Diagnosis not present

## 2021-02-27 ENCOUNTER — Encounter: Payer: Self-pay | Admitting: Family Medicine

## 2021-02-27 DIAGNOSIS — E039 Hypothyroidism, unspecified: Secondary | ICD-10-CM

## 2021-03-05 LAB — COLOGUARD: COLOGUARD: NEGATIVE

## 2021-03-06 MED ORDER — THYROID 30 MG PO TABS: 30.0000 mg | ORAL_TABLET | Freq: Every day | ORAL | 2 refills | Status: DC

## 2021-05-11 ENCOUNTER — Telehealth: Payer: Self-pay | Admitting: Family Medicine

## 2021-05-11 NOTE — Telephone Encounter (Signed)
Copied from [place] [id number]. Topic: General - Inquiry >> May 11, 2021  2:05 PM Oneta Rack wrote: Patient states unable to reach a rep in the billing department to discuss date of service 02/17/2021, stating her physical was not covered at all. Patient would like a follow up call from practice admin

## 2021-05-12 NOTE — Telephone Encounter (Signed)
Patient is aware. I let her know that if she has any other issues to give our office a call.

## 2021-05-14 ENCOUNTER — Other Ambulatory Visit: Payer: BC Managed Care – PPO

## 2021-05-14 ENCOUNTER — Other Ambulatory Visit: Payer: Self-pay

## 2021-05-14 DIAGNOSIS — E039 Hypothyroidism, unspecified: Secondary | ICD-10-CM

## 2021-05-15 ENCOUNTER — Other Ambulatory Visit: Payer: Self-pay | Admitting: Family Medicine

## 2021-05-15 ENCOUNTER — Other Ambulatory Visit: Payer: Self-pay

## 2021-05-15 DIAGNOSIS — E039 Hypothyroidism, unspecified: Secondary | ICD-10-CM

## 2021-05-15 LAB — TSH: TSH: 5.58 u[IU]/mL — ABNORMAL HIGH (ref 0.450–4.500)

## 2021-05-15 MED ORDER — THYROID 60 MG PO TABS
60.0000 mg | ORAL_TABLET | Freq: Every day | ORAL | 2 refills | Status: DC
Start: 2021-05-15 — End: 2021-05-15

## 2021-05-15 MED ORDER — THYROID 60 MG PO TABS: 60.0000 mg | ORAL_TABLET | Freq: Every day | ORAL | 0 refills | Status: DC

## 2021-05-15 NOTE — Telephone Encounter (Signed)
Patient would like medication sent to [facility] please.

## 2021-05-19 ENCOUNTER — Telehealth: Payer: Self-pay

## 2021-05-19 NOTE — Telephone Encounter (Signed)
Called and LVM asking for patient to please return my call to discuss ordering her mammogram for her.

## 2021-05-21 ENCOUNTER — Ambulatory Visit: Payer: BC Managed Care – PPO | Admitting: Internal Medicine

## 2021-07-21 DIAGNOSIS — E559 Vitamin D deficiency, unspecified: Secondary | ICD-10-CM | POA: Diagnosis not present

## 2021-07-21 DIAGNOSIS — E039 Hypothyroidism, unspecified: Secondary | ICD-10-CM | POA: Diagnosis not present

## 2021-08-24 ENCOUNTER — Encounter: Payer: Self-pay | Admitting: Family Medicine

## 2021-08-24 ENCOUNTER — Ambulatory Visit: Payer: BC Managed Care – PPO | Admitting: Family Medicine

## 2021-08-24 VITALS — BP 116/74 | HR 78 | Temp 98.4°F | Wt 216.4 lb

## 2021-08-24 DIAGNOSIS — E669 Obesity, unspecified: Secondary | ICD-10-CM | POA: Insufficient documentation

## 2021-08-24 DIAGNOSIS — E039 Hypothyroidism, unspecified: Secondary | ICD-10-CM | POA: Diagnosis not present

## 2021-08-24 NOTE — Assessment & Plan Note (Signed)
Started on normal thyroid 20 days ago. Will return for recheck on labs in 4 weeks. Call with any concerns. Continue to monitor.  ?

## 2021-08-24 NOTE — Assessment & Plan Note (Signed)
Will check on coverage for wegovy. If covered, will start. Follow up in 3 months if starting meds ?

## 2021-08-24 NOTE — Patient Instructions (Signed)
Check with your insurance to see if they will cover wegovy or saxenda for weight loss. If they do, I'm happy to prescribe. Send me a message and we'll get you started and follow up to make sure you're tolerating it a couple of months later depending on the medicine.  ?

## 2021-08-24 NOTE — Progress Notes (Signed)
? ?BP 116/74   Pulse 78   Temp 98.4 ?F (36.9 ?C)   Wt 216 lb 6.4 oz (98.2 kg)   SpO2 97%   BMI 38.33 kg/m?   ? ?Subjective:  ? ? Patient ID: Julia Jenkins, female    DOB: 09-28-1970, 51 y.o.   MRN: [id number] ? ?HPI: ?Julia Jenkins is a 51 y.o. female ? ?Chief Complaint  ?Patient presents with  ? Hypothyroidism  ?  Patient states she is no longer taking armour thyroid due to side effects and was put on NP thyroid tabs '30mg'$ .   ? Obesity  ?  Patient states she would like to discuss medication for weight loss   ? ?HYPOTHYROIDISM- got switched to NP thyroid for 20 days ?Thyroid control status:  unclear ?Satisfied with current treatment?  unclear ?Medication side effects: no ?Medication compliance: good compliance ?Recent dose adjustment:yes ?Fatigue: no ?Cold intolerance: no ?Heat intolerance: no ?Weight gain: yes ?Weight loss: no ?Constipation: no ?Diarrhea/loose stools: no ?Palpitations: no ?Lower extremity edema: no ?Anxiety/depressed mood: yes ? ?WEIGHT GAIN ?Duration: chronic ?Previous attempts at weight loss: yes ?Complications of obesity: HLD ?Peak weight: current (216) ?Weight loss goal: to be healthy ?Weight loss to date: none  ?Requesting obesity pharmacotherapy: yes ?Current weight loss supplements/medications: no ?Previous weight loss supplements/meds: no ? ?Relevant past medical, surgical, family and social history reviewed and updated as indicated. Interim medical history since our last visit reviewed. ?Allergies and medications reviewed and updated. ? ?Review of Systems  ?Constitutional: Negative.   ?Respiratory: Negative.    ?Cardiovascular: Negative.   ?Gastrointestinal: Negative.   ?Musculoskeletal: Negative.   ?Neurological: Negative.   ?Psychiatric/Behavioral: Negative.    ? ?Per HPI unless specifically indicated above ? ?   ?Objective:  ?  ?BP 116/74   Pulse 78   Temp 98.4 ?F (36.9 ?C)   Wt 216 lb 6.4 oz (98.2 kg)   SpO2 97%   BMI 38.33 kg/m?   ?Wt Readings from Last 3 Encounters:  ?08/24/21 216  lb 6.4 oz (98.2 kg)  ?02/17/21 206 lb (93.4 kg)  ?12/29/20 206 lb 4 oz (93.6 kg)  ?  ?Physical Exam ?Vitals and nursing note reviewed.  ?Constitutional:   ?   General: She is not in acute distress. ?   Appearance: Normal appearance. She is not ill-appearing, toxic-appearing or diaphoretic.  ?HENT:  ?   Head: Normocephalic and atraumatic.  ?   Right Ear: External ear normal.  ?   Left Ear: External ear normal.  ?   Nose: Nose normal.  ?   Mouth/Throat:  ?   Mouth: Mucous membranes are moist.  ?   Pharynx: Oropharynx is clear.  ?Eyes:  ?   General: No scleral icterus.    ?   Right eye: No discharge.     ?   Left eye: No discharge.  ?   Extraocular Movements: Extraocular movements intact.  ?   Conjunctiva/sclera: Conjunctivae normal.  ?   Pupils: Pupils are equal, round, and reactive to light.  ?Cardiovascular:  ?   Rate and Rhythm: Normal rate and regular rhythm.  ?   Pulses: Normal pulses.  ?   Heart sounds: Normal heart sounds. No murmur heard. ?  No friction rub. No gallop.  ?Pulmonary:  ?   Effort: Pulmonary effort is normal. No respiratory distress.  ?   Breath sounds: Normal breath sounds. No stridor. No wheezing, rhonchi or rales.  ?Chest:  ?   Chest wall: No tenderness.  ?Musculoskeletal:     ?  General: Normal range of motion.  ?   Cervical back: Normal range of motion and neck supple.  ?Skin: ?   General: Skin is warm and dry.  ?   Capillary Refill: Capillary refill takes less than 2 seconds.  ?   Coloration: Skin is not jaundiced or pale.  ?   Findings: No bruising, erythema, lesion or rash.  ?Neurological:  ?   General: No focal deficit present.  ?   Mental Status: She is alert and oriented to person, place, and time. Mental status is at baseline.  ?Psychiatric:     ?   Mood and Affect: Mood normal.     ?   Behavior: Behavior normal.     ?   Thought Content: Thought content normal.     ?   Judgment: Judgment normal.  ? ? ?Results for orders placed or performed in visit on 05/14/21  ?TSH  ?Result Value Ref  Range  ? TSH 5.580 (H) 0.450 - 4.500 uIU/mL  ? ?   ?Assessment & Plan:  ? ?Problem List Items Addressed This Visit   ? ?  ? Endocrine  ? Acquired hypothyroidism - Primary  ?  Started on normal thyroid 20 days ago. Will return for recheck on labs in 4 weeks. Call with any concerns. Continue to monitor.  ? ?  ?  ? Relevant Medications  ? NP THYROID 30 MG tablet  ? Other Relevant Orders  ? TSH  ?  ? Other  ? Obesity (BMI 30-39.9)  ?  Will check on coverage for wegovy. If covered, will start. Follow up in 3 months if starting meds ? ?  ?  ?  ? ?Follow up plan: ?Return in about 6 months (around 02/23/2022) for physical. ? ? ? ? ? ?

## 2021-08-26 ENCOUNTER — Encounter: Payer: Self-pay | Admitting: Family Medicine

## 2021-10-13 DIAGNOSIS — Z1321 Encounter for screening for nutritional disorder: Secondary | ICD-10-CM | POA: Diagnosis not present

## 2021-10-13 DIAGNOSIS — E039 Hypothyroidism, unspecified: Secondary | ICD-10-CM | POA: Diagnosis not present

## 2021-11-19 ENCOUNTER — Encounter: Payer: Self-pay | Admitting: Family Medicine

## 2021-11-19 NOTE — Telephone Encounter (Signed)
appt

## 2021-11-19 NOTE — Telephone Encounter (Signed)
Pt scheduled  

## 2021-11-24 ENCOUNTER — Ambulatory Visit: Payer: BC Managed Care – PPO | Admitting: Family Medicine

## 2021-11-24 ENCOUNTER — Encounter: Payer: Self-pay | Admitting: Family Medicine

## 2021-11-24 DIAGNOSIS — E669 Obesity, unspecified: Secondary | ICD-10-CM

## 2021-11-24 DIAGNOSIS — E039 Hypothyroidism, unspecified: Secondary | ICD-10-CM

## 2021-11-24 MED ORDER — SEMAGLUTIDE-WEIGHT MANAGEMENT 2.4 MG/0.75ML ~~LOC~~ SOAJ: 2.4000 mg | SUBCUTANEOUS | 1 refills | Status: DC

## 2021-11-24 MED ORDER — SEMAGLUTIDE-WEIGHT MANAGEMENT 0.25 MG/0.5ML ~~LOC~~ SOAJ: 0.2500 mg | SUBCUTANEOUS | 0 refills | Status: AC

## 2021-11-24 MED ORDER — SEMAGLUTIDE-WEIGHT MANAGEMENT 1.7 MG/0.75ML ~~LOC~~ SOAJ: 1.7000 mg | SUBCUTANEOUS | 0 refills | Status: DC

## 2021-11-24 MED ORDER — SEMAGLUTIDE-WEIGHT MANAGEMENT 1 MG/0.5ML ~~LOC~~ SOAJ: 1.0000 mg | SUBCUTANEOUS | 0 refills | Status: DC

## 2021-11-24 MED ORDER — SEMAGLUTIDE-WEIGHT MANAGEMENT 0.5 MG/0.5ML ~~LOC~~ SOAJ: 0.5000 mg | SUBCUTANEOUS | 0 refills | Status: AC

## 2021-11-24 NOTE — Assessment & Plan Note (Addendum)
Following with endocrinology. Call with any concerns. Continue to monitor.

## 2021-11-24 NOTE — Assessment & Plan Note (Addendum)
Insurance does not cover wegovy or any weight loss medications. Discussed that monjaro and ozempic are approved for diabetes. Encouraged diet and exercise. Call with any concerns. She would like to see if it can get covered. Will send it in. Recheck 3 months.

## 2021-11-24 NOTE — Progress Notes (Signed)
BP 123/80   Pulse 83   Temp 98.3 F (36.8 C)   Wt 215 lb (97.5 kg)   SpO2 98%   BMI 38.09 kg/m    Subjective:    Patient ID: Julia Jenkins, female    DOB: Jul 27, 1970, 51 y.o.   MRN: [id number]  HPI: Julia Jenkins is a 51 y.o. female  Chief Complaint  Patient presents with   Obesity    Patient would like to discuss weight loss medication today    Hypothyroidism   OBESITY Duration: chronic Previous attempts at weight loss: yes Complications of obesity: osteoarthritis, HTN, Anxiety Peak weight: 216 Weight loss goal: to be healthy Weight loss to date: 1 lb  Requesting obesity pharmacotherapy: yes Current weight loss supplements/medications: no Previous weight loss supplements/meds: no  HYPOTHYROIDISM Thyroid control status:uncontrolled Satisfied with current treatment? no Medication side effects: no Medication compliance: excellent compliance Recent dose adjustment:yes Fatigue: yes Cold intolerance: no Heat intolerance: no Weight gain: yes Weight loss: no Constipation: no Diarrhea/loose stools: no Palpitations: no Lower extremity edema: no Anxiety/depressed mood: yes  Relevant past medical, surgical, family and social history reviewed and updated as indicated. Interim medical history since our last visit reviewed. Allergies and medications reviewed and updated.  Review of Systems  Constitutional: Negative.   Respiratory: Negative.    Cardiovascular: Negative.   Gastrointestinal: Negative.   Musculoskeletal: Negative.   Psychiatric/Behavioral:  Negative for agitation, behavioral problems, confusion, decreased concentration, dysphoric mood, hallucinations, self-injury, sleep disturbance and suicidal ideas. The patient is nervous/anxious. The patient is not hyperactive.     Per HPI unless specifically indicated above     Objective:    BP 123/80   Pulse 83   Temp 98.3 F (36.8 C)   Wt 215 lb (97.5 kg)   SpO2 98%   BMI 38.09 kg/m   Wt Readings from Last 3  Encounters:  11/24/21 215 lb (97.5 kg)  08/24/21 216 lb 6.4 oz (98.2 kg)  02/17/21 206 lb (93.4 kg)    Physical Exam Vitals and nursing note reviewed.  Constitutional:      General: She is not in acute distress.    Appearance: Normal appearance. She is obese. She is not ill-appearing, toxic-appearing or diaphoretic.  HENT:     Head: Normocephalic and atraumatic.     Right Ear: External ear normal.     Left Ear: External ear normal.     Nose: Nose normal.     Mouth/Throat:     Mouth: Mucous membranes are moist.     Pharynx: Oropharynx is clear.  Eyes:     General: No scleral icterus.       Right eye: No discharge.        Left eye: No discharge.     Extraocular Movements: Extraocular movements intact.     Conjunctiva/sclera: Conjunctivae normal.     Pupils: Pupils are equal, round, and reactive to light.  Cardiovascular:     Rate and Rhythm: Normal rate and regular rhythm.     Pulses: Normal pulses.     Heart sounds: Normal heart sounds. No murmur heard.    No friction rub. No gallop.  Pulmonary:     Effort: Pulmonary effort is normal. No respiratory distress.     Breath sounds: Normal breath sounds. No stridor. No wheezing, rhonchi or rales.  Chest:     Chest wall: No tenderness.  Musculoskeletal:        General: Normal range of motion.     Cervical back:  Normal range of motion and neck supple.  Skin:    General: Skin is warm and dry.     Capillary Refill: Capillary refill takes less than 2 seconds.     Coloration: Skin is not jaundiced or pale.     Findings: No bruising, erythema, lesion or rash.  Neurological:     General: No focal deficit present.     Mental Status: She is alert and oriented to person, place, and time. Mental status is at baseline.  Psychiatric:        Mood and Affect: Mood normal.        Behavior: Behavior normal.        Thought Content: Thought content normal.        Judgment: Judgment normal.     Results for orders placed or performed in  visit on 05/14/21  TSH  Result Value Ref Range   TSH 5.580 (H) 0.450 - 4.500 uIU/mL      Assessment & Plan:   Problem List Items Addressed This Visit       Endocrine   Acquired hypothyroidism    Following with endocrinology. Call with any concerns. Continue to monitor.       Relevant Medications   NP THYROID 60 MG tablet     Other   Obesity (BMI 30-39.9)    Insurance does not cover wegovy or any weight loss medications. Discussed that monjaro and ozempic are approved for diabetes. Encouraged diet and exercise. Call with any concerns. She would like to see if it can get covered. Will send it in. Recheck 3 months.       Relevant Medications   Semaglutide-Weight Management 0.25 MG/0.5ML SOAJ   Semaglutide-Weight Management 0.5 MG/0.5ML SOAJ (Start on 12/23/2021)   Semaglutide-Weight Management 1 MG/0.5ML SOAJ (Start on 01/21/2022)   Semaglutide-Weight Management 1.7 MG/0.75ML SOAJ (Start on 02/19/2022)   Semaglutide-Weight Management 2.4 MG/0.75ML SOAJ (Start on 03/20/2022)     Follow up plan: Return in about 3 months (around 02/24/2022).

## 2021-12-18 ENCOUNTER — Telehealth: Payer: Self-pay

## 2021-12-18 NOTE — Telephone Encounter (Signed)
Attempted to contact patient via [place] and via telephone call to request pharmacy benefit information to proceed with [facility]. Attempted to do PA and no weight loss coverage was found for this patient.

## 2021-12-18 NOTE — Telephone Encounter (Signed)
-----   Message from Valerie Roys, [place] sent at 11/24/2021  3:23 PM EDT ----- Mancel Parsons PA please

## 2022-02-02 ENCOUNTER — Ambulatory Visit: Payer: BC Managed Care – PPO | Admitting: Family Medicine

## 2022-02-02 ENCOUNTER — Encounter: Payer: Self-pay | Admitting: Family Medicine

## 2022-02-02 VITALS — BP 127/87 | HR 99 | Temp 98.7°F | Wt 211.9 lb

## 2022-02-02 DIAGNOSIS — Z13220 Encounter for screening for lipoid disorders: Secondary | ICD-10-CM

## 2022-02-02 DIAGNOSIS — I10 Essential (primary) hypertension: Secondary | ICD-10-CM

## 2022-02-02 DIAGNOSIS — E559 Vitamin D deficiency, unspecified: Secondary | ICD-10-CM

## 2022-02-02 DIAGNOSIS — R221 Localized swelling, mass and lump, neck: Secondary | ICD-10-CM

## 2022-02-02 DIAGNOSIS — E039 Hypothyroidism, unspecified: Secondary | ICD-10-CM

## 2022-02-02 NOTE — Assessment & Plan Note (Signed)
Doing well off medicine. Checking labs today. Await results. Treat as needed.  

## 2022-02-02 NOTE — Progress Notes (Signed)
BP 127/87   Pulse 99   Temp 98.7 F (37.1 C)   Wt 211 lb 14.4 oz (96.1 kg)   SpO2 97%   BMI 37.54 kg/m    Subjective:    Patient ID: Julia Jenkins, female    DOB: Aug 27, 1970, 51 y.o.   MRN: [id number]  HPI: Julia Jenkins is a 51 y.o. female  Chief Complaint  Patient presents with   Hypothyroidism    Patient here for refill on thyroid medication. States she has a lump on her neck that she would like looked at.    HYPOTHYROIDISM Thyroid control status:controlled Satisfied with current treatment? yes Medication side effects: no Medication compliance: excellent compliance Recent dose adjustment:yes- 3 months ago Fatigue: no Cold intolerance: no Heat intolerance: no Weight gain: no Weight loss: no Constipation: no Diarrhea/loose stools: no Palpitations: no Lower extremity edema: no Anxiety/depressed mood: no  LUMP Duration: months Location: L side of her neck Onset: gradual Painful: no Discomfort: no Status:  bigger Trauma: no Redness: no Bruising: no Recent infection: no Swollen lymph nodes: no Requesting removal: no History of cancer: no History of the same: yes- had it removed about 3-4 years ago  Relevant past medical, surgical, family and social history reviewed and updated as indicated. Interim medical history since our last visit reviewed. Allergies and medications reviewed and updated.  Review of Systems  Constitutional: Negative.   Respiratory: Negative.    Cardiovascular: Negative.   Gastrointestinal: Negative.   Musculoskeletal: Negative.   Neurological: Negative.   Psychiatric/Behavioral: Negative.      Per HPI unless specifically indicated above     Objective:    BP 127/87   Pulse 99   Temp 98.7 F (37.1 C)   Wt 211 lb 14.4 oz (96.1 kg)   SpO2 97%   BMI 37.54 kg/m   Wt Readings from Last 3 Encounters:  02/02/22 211 lb 14.4 oz (96.1 kg)  11/24/21 215 lb (97.5 kg)  08/24/21 216 lb 6.4 oz (98.2 kg)    Physical Exam Vitals and  nursing note reviewed.  Constitutional:      General: She is not in acute distress.    Appearance: Normal appearance. She is obese. She is not ill-appearing, toxic-appearing or diaphoretic.  HENT:     Head: Normocephalic and atraumatic.     Right Ear: External ear normal.     Left Ear: External ear normal.     Nose: Nose normal.     Mouth/Throat:     Mouth: Mucous membranes are moist.     Pharynx: Oropharynx is clear.  Eyes:     General: No scleral icterus.       Right eye: No discharge.        Left eye: No discharge.     Extraocular Movements: Extraocular movements intact.     Conjunctiva/sclera: Conjunctivae normal.     Pupils: Pupils are equal, round, and reactive to light.  Neck:     Comments: Lipoma on L side of her neck Cardiovascular:     Rate and Rhythm: Normal rate and regular rhythm.     Pulses: Normal pulses.     Heart sounds: Normal heart sounds. No murmur heard.    No friction rub. No gallop.  Pulmonary:     Effort: Pulmonary effort is normal. No respiratory distress.     Breath sounds: Normal breath sounds. No stridor. No wheezing, rhonchi or rales.  Chest:     Chest wall: No tenderness.  Musculoskeletal:  General: Normal range of motion.     Cervical back: Normal range of motion and neck supple.  Skin:    General: Skin is warm and dry.     Capillary Refill: Capillary refill takes less than 2 seconds.     Coloration: Skin is not jaundiced or pale.     Findings: No bruising, erythema, lesion or rash.  Neurological:     General: No focal deficit present.     Mental Status: She is alert and oriented to person, place, and time. Mental status is at baseline.  Psychiatric:        Mood and Affect: Mood normal.        Behavior: Behavior normal.        Thought Content: Thought content normal.        Judgment: Judgment normal.     Results for orders placed or performed in visit on 05/14/21  TSH  Result Value Ref Range   TSH 5.580 (H) 0.450 - 4.500 uIU/mL       Assessment & Plan:   Problem List Items Addressed This Visit       Cardiovascular and Mediastinum   Essential hypertension    Doing well off medicine. Checking labs today. Await results. Treat as needed.       Relevant Orders   CBC with Differential/Platelet   Comprehensive metabolic panel   Urinalysis, Routine w reflex microscopic   Microalbumin, Urine Waived     Endocrine   Acquired hypothyroidism - Primary    Rechecking labs today. Await results. Treat as needed.       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 Would like to hold on going to general surgery. Call with any concerns.    Relevant Orders   CBC with Differential/Platelet   Comprehensive metabolic panel   Screening for cholesterol level       Labs drawn today. Await results.    Relevant Orders   Lipid Panel w/o Chol/HDL Ratio   Vitamin D deficiency       Labs drawn today. Await results.    Relevant Orders   VITAMIN D 25 Hydroxy (Vit-D Deficiency, Fractures)        Follow up plan: Return in about 16 days (around 02/18/2022) for physical.

## 2022-02-02 NOTE — Assessment & Plan Note (Signed)
Rechecking labs today. Await results. Treat as needed.  °

## 2022-02-03 LAB — COMPREHENSIVE METABOLIC PANEL
ALT: 30 IU/L (ref 0–32)
AST: 19 IU/L (ref 0–40)
Albumin/Globulin Ratio: 2 (ref 1.2–2.2)
Albumin: 4.6 g/dL (ref 3.8–4.9)
Alkaline Phosphatase: 108 IU/L (ref 44–121)
BUN/Creatinine Ratio: 25 — ABNORMAL HIGH (ref 9–23)
BUN: 16 mg/dL (ref 6–24)
Bilirubin Total: 0.4 mg/dL (ref 0.0–1.2)
CO2: 20 mmol/L (ref 20–29)
Calcium: 9.5 mg/dL (ref 8.7–10.2)
Chloride: 103 mmol/L (ref 96–106)
Creatinine, Ser: 0.65 mg/dL (ref 0.57–1.00)
Globulin, Total: 2.3 g/dL (ref 1.5–4.5)
Glucose: 85 mg/dL (ref 70–99)
Potassium: 4 mmol/L (ref 3.5–5.2)
Sodium: 140 mmol/L (ref 134–144)
Total Protein: 6.9 g/dL (ref 6.0–8.5)
eGFR: 105 mL/min/{1.73_m2} (ref >59)

## 2022-02-03 LAB — TSH: TSH: 2.47 u[IU]/mL (ref 0.450–4.500)

## 2022-02-03 LAB — LIPID PANEL W/O CHOL/HDL RATIO
Cholesterol, Total: 217 mg/dL — ABNORMAL HIGH (ref 100–199)
HDL: 47 mg/dL (ref >39)
LDL Chol Calc (NIH): 144 mg/dL — ABNORMAL HIGH (ref 0–99)
Triglycerides: 146 mg/dL (ref 0–149)
VLDL Cholesterol Cal: 26 mg/dL (ref 5–40)

## 2022-02-03 LAB — VITAMIN D 25 HYDROXY (VIT D DEFICIENCY, FRACTURES): Vit D, 25-Hydroxy: 20.5 ng/mL — ABNORMAL LOW (ref 30.0–100.0)

## 2022-02-09 ENCOUNTER — Other Ambulatory Visit: Payer: Self-pay | Admitting: Family Medicine

## 2022-02-09 NOTE — Telephone Encounter (Signed)
Pt was in the office last week and thought Dr. Wynetta Emery was putting in an order for her thyroid medication. The name is in her chart but she does not know the name.  She said she does not think it was sent in yet.  She would ike it sent to [facility] 90 days.  CB@  [phone]

## 2022-02-11 ENCOUNTER — Other Ambulatory Visit: Payer: Self-pay | Admitting: Family Medicine

## 2022-02-11 MED ORDER — NP THYROID 60 MG PO TABS: 60.0000 mg | ORAL_TABLET | Freq: Every day | ORAL | 3 refills | Status: DC

## 2022-02-19 ENCOUNTER — Encounter: Payer: Self-pay | Admitting: Family Medicine

## 2022-02-19 ENCOUNTER — Ambulatory Visit (INDEPENDENT_AMBULATORY_CARE_PROVIDER_SITE_OTHER): Payer: BC Managed Care – PPO | Admitting: Family Medicine

## 2022-02-19 VITALS — BP 115/78 | HR 78 | Temp 98.2°F | Ht 63.5 in | Wt 211.7 lb

## 2022-02-19 DIAGNOSIS — Z1231 Encounter for screening mammogram for malignant neoplasm of breast: Secondary | ICD-10-CM | POA: Diagnosis not present

## 2022-02-19 DIAGNOSIS — Z111 Encounter for screening for respiratory tuberculosis: Secondary | ICD-10-CM

## 2022-02-19 DIAGNOSIS — Z0000 Encounter for general adult medical examination without abnormal findings: Secondary | ICD-10-CM

## 2022-02-19 MED ORDER — HYDROCORTISONE ACETATE 25 MG RE SUPP: 25.0000 mg | Freq: Two times a day (BID) | RECTAL | 6 refills | Status: DC

## 2022-02-19 NOTE — Progress Notes (Signed)
BP 115/78   Pulse 78   Temp 98.2 F (36.8 C)   Ht 5' 3.5" (1.613 m)   Wt 211 lb 11.2 oz (96 kg)   SpO2 98%   BMI 36.91 kg/m    Subjective:    Patient ID: Julia Jenkins, female    DOB: [birth year], 51 y.o.   MRN: [id number]  HPI: Julia Jenkins is a 51 y.o. female presenting on 02/19/2022 for comprehensive medical examination. Current medical complaints include: having issues with hemorrhoids. Needs a paper filled out for fostering.  Menopausal Symptoms: no  Depression Screen done today and results listed below:     02/19/2022   10:47 AM 11/24/2021    3:07 PM 08/24/2021    2:31 PM 02/17/2021    9:59 AM 12/29/2020    9:32 AM  Depression screen PHQ 2/9  Decreased Interest 0 0 0 0 0  Down, Depressed, Hopeless 0 0 0 0 0  PHQ - 2 Score 0 0 0 0 0  Altered sleeping 0 1 0 0   Tired, decreased energy 0 2 3 0   Change in appetite 0 3 3 0   Feeling bad or failure about yourself  0 1 0 0   Trouble concentrating 0 0 0 0   Moving slowly or fidgety/restless 0 0 0 0   Suicidal thoughts 0 0 0 0   PHQ-9 Score 0 7 6 0   Difficult doing work/chores  Not difficult at all      Past Medical History:  Past Medical History:  Diagnosis Date   Hypertension    Lipoma of neck     Surgical History:  Past Surgical History:  Procedure Laterality Date   CESAREAN SECTION     LIPOMA EXCISION     Neck   TUBAL LIGATION     WISDOM TOOTH EXTRACTION      Medications:  Current Outpatient Medications on File Prior to Visit  Medication Sig   Cholecalciferol (VITAMIN D3 PO) Take by mouth.   NP THYROID 60 MG tablet Take 1 tablet (60 mg total) by mouth daily.   TURMERIC PO Take by mouth.   No current facility-administered medications on file prior to visit.    Allergies:  Allergies  Allergen Reactions   Cymbalta [Duloxetine Hcl] Other (See Comments)    Felt strange    Social History:  Social History   Socioeconomic History   Marital status: Married    Spouse name: Not on file   Number of  children: Not on file   Years of education: Not on file   Highest education level: Not on file  Occupational History   Not on file  Tobacco Use   Smoking status: Former    Types: E-cigarettes    Quit date: 2010    Years since quitting: 13.8   Smokeless tobacco: Never  Vaping Use   Vaping Use: Never used  Substance and Sexual Activity   Alcohol use: Not Currently   Drug use: Never   Sexual activity: Yes    Birth control/protection: None  Other Topics Concern   Not on file  Social History Narrative   Not on file   Social Determinants of Health   Financial Resource Strain: Not on file  Food Insecurity: Not on file  Transportation Needs: Not on file  Physical Activity: Not on file  Stress: Not on file  Social Connections: Not on file  Intimate Partner Violence: Not on file   Social History   Tobacco  Use  Smoking Status Former   Types: E-cigarettes   Quit date: 2010   Years since quitting: 13.8  Smokeless Tobacco Never   Social History   Substance and Sexual Activity  Alcohol Use Not Currently    Family History:  Family History  Problem Relation Age of Onset   Heart disease Maternal Grandmother    Hypertension Maternal Grandmother    Heart disease Maternal Grandfather    Hypertension Maternal Grandfather    Heart disease Paternal Grandmother    Hypertension Paternal Grandmother    Heart disease Paternal Grandfather    Heart attack Paternal Grandfather    Hypertension Paternal Grandfather    Hyperlipidemia Mother    Heart disease Father    Heart attack Father    Prostate cancer Father    Hypertension Father    Drug abuse Sister    Depression Sister    COPD Sister    Drug abuse Sister    Schizophrenia Sister    Personality disorder Sister     Past medical history, surgical history, medications, allergies, family history and social history reviewed with patient today and changes made to appropriate areas of the chart.   Review of Systems   Constitutional: Negative.   HENT: Negative.    Eyes: Negative.   Respiratory: Negative.    Cardiovascular: Negative.   Gastrointestinal: Negative.   Genitourinary: Negative.   Musculoskeletal: Negative.   Skin: Negative.   Neurological: Negative.   Endo/Heme/Allergies: Negative.   Psychiatric/Behavioral: Negative.     All other ROS negative except what is listed above and in the HPI.      Objective:    BP 115/78   Pulse 78   Temp 98.2 F (36.8 C)   Ht 5' 3.5" (1.613 m)   Wt 211 lb 11.2 oz (96 kg)   SpO2 98%   BMI 36.91 kg/m   Wt Readings from Last 3 Encounters:  02/19/22 211 lb 11.2 oz (96 kg)  02/02/22 211 lb 14.4 oz (96.1 kg)  11/24/21 215 lb (97.5 kg)    Physical Exam Vitals and nursing note reviewed.  Constitutional:      General: She is not in acute distress.    Appearance: Normal appearance. She is obese. She is not ill-appearing, toxic-appearing or diaphoretic.  HENT:     Head: Normocephalic and atraumatic.     Right Ear: Tympanic membrane, ear canal and external ear normal. There is no impacted cerumen.     Left Ear: Tympanic membrane, ear canal and external ear normal. There is no impacted cerumen.     Nose: Nose normal. No congestion or rhinorrhea.     Mouth/Throat:     Mouth: Mucous membranes are moist.     Pharynx: Oropharynx is clear. No oropharyngeal exudate or posterior oropharyngeal erythema.  Eyes:     General: No scleral icterus.       Right eye: No discharge.        Left eye: No discharge.     Extraocular Movements: Extraocular movements intact.     Conjunctiva/sclera: Conjunctivae normal.     Pupils: Pupils are equal, round, and reactive to light.  Neck:     Vascular: No carotid bruit.  Cardiovascular:     Rate and Rhythm: Normal rate and regular rhythm.     Pulses: Normal pulses.     Heart sounds: No murmur heard.    No friction rub. No gallop.  Pulmonary:     Effort: Pulmonary effort is normal. No respiratory distress.  Breath  sounds: Normal breath sounds. No stridor. No wheezing, rhonchi or rales.  Chest:     Chest wall: No tenderness.  Abdominal:     General: Abdomen is flat. Bowel sounds are normal. There is no distension.     Palpations: Abdomen is soft. There is no mass.     Tenderness: There is no abdominal tenderness. There is no right CVA tenderness, left CVA tenderness, guarding or rebound.     Hernia: No hernia is present.  Genitourinary:    Comments: Breast and pelvic exams deferred with shared decision making Musculoskeletal:        General: No swelling, tenderness, deformity or signs of injury.     Cervical back: Normal range of motion and neck supple. No rigidity. No muscular tenderness.     Right lower leg: No edema.     Left lower leg: No edema.  Lymphadenopathy:     Cervical: No cervical adenopathy.  Skin:    General: Skin is warm and dry.     Capillary Refill: Capillary refill takes less than 2 seconds.     Coloration: Skin is not jaundiced or pale.     Findings: No bruising, erythema, lesion or rash.  Neurological:     General: No focal deficit present.     Mental Status: She is alert and oriented to person, place, and time. Mental status is at baseline.     Cranial Nerves: No cranial nerve deficit.     Sensory: No sensory deficit.     Motor: No weakness.     Coordination: Coordination normal.     Gait: Gait normal.     Deep Tendon Reflexes: Reflexes normal.  Psychiatric:        Mood and Affect: Mood normal.        Behavior: Behavior normal.        Thought Content: Thought content normal.        Judgment: Judgment normal.     Results for orders placed or performed in visit on 02/02/22  Comprehensive metabolic panel  Result Value Ref Range   Glucose 85 70 - 99 mg/dL   BUN 16 6 - 24 mg/dL   Creatinine, Ser 0.65 0.57 - 1.00 mg/dL   eGFR 105 >59 mL/min/1.73   BUN/Creatinine Ratio 25 (H) 9 - 23   Sodium 140 134 - 144 mmol/L   Potassium 4.0 3.5 - 5.2 mmol/L   Chloride 103 96 -  106 mmol/L   CO2 20 20 - 29 mmol/L   Calcium 9.5 8.7 - 10.2 mg/dL   Total Protein 6.9 6.0 - 8.5 g/dL   Albumin 4.6 3.8 - 4.9 g/dL   Globulin, Total 2.3 1.5 - 4.5 g/dL   Albumin/Globulin Ratio 2.0 1.2 - 2.2   Bilirubin Total 0.4 0.0 - 1.2 mg/dL   Alkaline Phosphatase 108 44 - 121 IU/L   AST 19 0 - 40 IU/L   ALT 30 0 - 32 IU/L  Lipid Panel w/o Chol/HDL Ratio  Result Value Ref Range   Cholesterol, Total 217 (H) 100 - 199 mg/dL   Triglycerides 146 0 - 149 mg/dL   HDL 47 >39 mg/dL   VLDL Cholesterol Cal 26 5 - 40 mg/dL   LDL Chol Calc (NIH) 144 (H) 0 - 99 mg/dL  VITAMIN D 25 Hydroxy (Vit-D Deficiency, Fractures)  Result Value Ref Range   Vit D, 25-Hydroxy 20.5 (L) 30.0 - 100.0 ng/mL  TSH  Result Value Ref Range   TSH 2.470 0.450 -  4.500 uIU/mL      Assessment & Plan:   Problem List Items Addressed This Visit   None Visit Diagnoses     Routine general medical examination at a health care facility    -  Primary   Vaccines declined. Screening labs checked today. Pap and cologuard up to date. Mammo ordered. Continue diet and exercise. Call with any concerns.    Relevant Orders   Hepatitis C Antibody   HIV Antibody (routine testing w rflx)   Screening for tuberculosis       Needs testing for fostering. Drawn today.   Relevant Orders   QuantiFERON-TB Gold Plus   Encounter for screening mammogram for malignant neoplasm of breast       Mammogram ordered today.   Relevant Orders   MM 3D SCREEN BREAST BILATERAL        Follow up plan: Return in about 1 year (around 02/20/2023) for physical.   LABORATORY TESTING:  - Pap smear: up to date  IMMUNIZATIONS:   - Tdap: Tetanus vaccination status reviewed: declined. - Influenza: Refused - Pneumovax: Not applicable - Prevnar: Not applicable - COVID: Refused - HPV: Not applicable - Shingrix vaccine: Refused  SCREENING: -Mammogram: Ordered today  - Colonoscopy: Up to date   PATIENT COUNSELING:   Advised to take 1 mg of  folate supplement per day if capable of pregnancy.   Sexuality: Discussed sexually transmitted diseases, partner selection, use of condoms, avoidance of unintended pregnancy  and contraceptive alternatives.   Advised to avoid cigarette smoking.  I discussed with the patient that most people either abstain from alcohol or drink within safe limits (<=14/week and <=4 drinks/occasion for males, <=7/weeks and <= 3 drinks/occasion for females) and that the risk for alcohol disorders and other health effects rises proportionally with the number of drinks per week and how often a drinker exceeds daily limits.  Discussed cessation/primary prevention of drug use and availability of treatment for abuse.   Diet: Encouraged to adjust caloric intake to maintain  or achieve ideal body weight, to reduce intake of dietary saturated fat and total fat, to limit sodium intake by avoiding high sodium foods and not adding table salt, and to maintain adequate dietary potassium and calcium preferably from fresh fruits, vegetables, and low-fat dairy products.    stressed the importance of regular exercise  Injury prevention: Discussed safety belts, safety helmets, smoke detector, smoking near bedding or upholstery.   Dental health: Discussed importance of regular tooth brushing, flossing, and dental visits.    NEXT PREVENTATIVE PHYSICAL DUE IN 1 YEAR. Return in about 1 year (around 02/20/2023) for physical.

## 2022-02-19 NOTE — Patient Instructions (Signed)
Please call to schedule your mammogram and/or bone density: [facility]  Address: [address], [place], [place] [address] Phone: [phone]  [facility] [place] [address] [place],  [place]  [address] Phone: [phone]   

## 2022-02-22 ENCOUNTER — Encounter: Payer: BC Managed Care – PPO | Admitting: Family Medicine

## 2022-02-23 ENCOUNTER — Encounter: Payer: Self-pay | Admitting: Family Medicine

## 2022-02-23 LAB — QUANTIFERON-TB GOLD PLUS
QuantiFERON Mitogen Value: >10 IU/mL
QuantiFERON Nil Value: 0.12 IU/mL
QuantiFERON TB1 Ag Value: 0.12 IU/mL
QuantiFERON TB2 Ag Value: 0.2 IU/mL
QuantiFERON-TB Gold Plus: NEGATIVE

## 2022-02-23 LAB — HEPATITIS C ANTIBODY: Hep C Virus Ab: NONREACTIVE

## 2022-02-23 LAB — HIV ANTIBODY (ROUTINE TESTING W REFLEX): HIV Screen 4th Generation wRfx: NONREACTIVE

## 2022-02-23 NOTE — Telephone Encounter (Signed)
Patient called and made aware form is complete, will pick up at the front.

## 2022-03-15 ENCOUNTER — Encounter: Payer: Self-pay | Admitting: Physician Assistant

## 2022-03-15 ENCOUNTER — Ambulatory Visit (INDEPENDENT_AMBULATORY_CARE_PROVIDER_SITE_OTHER): Payer: BC Managed Care – PPO | Admitting: Physician Assistant

## 2022-03-15 VITALS — BP 117/78 | HR 87 | Temp 98.3°F | Wt 214.8 lb

## 2022-03-15 DIAGNOSIS — N390 Urinary tract infection, site not specified: Secondary | ICD-10-CM | POA: Diagnosis not present

## 2022-03-15 DIAGNOSIS — R300 Dysuria: Secondary | ICD-10-CM | POA: Diagnosis not present

## 2022-03-15 DIAGNOSIS — M545 Low back pain, unspecified: Secondary | ICD-10-CM

## 2022-03-15 LAB — MICROSCOPIC EXAMINATION: Bacteria, UA: NONE SEEN

## 2022-03-15 LAB — URINALYSIS, ROUTINE W REFLEX MICROSCOPIC
Bilirubin, UA: NEGATIVE
Glucose, UA: NEGATIVE
Ketones, UA: NEGATIVE
Nitrite, UA: NEGATIVE
Protein,UA: NEGATIVE
RBC, UA: NEGATIVE
Specific Gravity, UA: >1.03 — ABNORMAL HIGH (ref 1.005–1.030)
Urobilinogen, Ur: 0.2 mg/dL (ref 0.2–1.0)
pH, UA: 5 (ref 5.0–7.5)

## 2022-03-15 MED ORDER — PHENAZOPYRIDINE HCL 200 MG PO TABS: 200.0000 mg | ORAL_TABLET | Freq: Three times a day (TID) | ORAL | 0 refills | Status: DC | PRN

## 2022-03-15 MED ORDER — SULFAMETHOXAZOLE-TRIMETHOPRIM 800-160 MG PO TABS: 1.0000 | ORAL_TABLET | Freq: Two times a day (BID) | ORAL | 0 refills | Status: AC

## 2022-03-15 NOTE — Progress Notes (Signed)
Established Patient Office Visit  Name: Julia Jenkins   MRN: [id number]    DOB: [birth year]   Date:03/15/2022  Today's Provider: Talitha Givens, MHS, PA-C Introduced myself to the patient as a PA-C and provided education on APPs in clinical practice.         Subjective  Chief Complaint  Chief Complaint  Patient presents with   Back Pain    Patient states she has lower left back pain for 3 days. Patient states she feels like her bladder is full but when she urinates its only a little bit. Patient has urinary frequency as well.     Back Pain Associated symptoms include dysuria. Pertinent negatives include no fever or headaches.   States this started Friday night Reports pain in lower back/flank area with some radiation to lower abdomen Reports mild nausea  States yesterday she had trouble standing in church but states today she feels much better She states she has had some radiation into her leg and pain was improved with massage   Reports pain seems to be predominantly on the left side of lower back Reports dysuria, and minimal relief with urinating  Reports some sensation of feeling numb in urethral area and reduced sensation during intercourse She reports pain 8-9/10 in intensity  She reports a kidney stone about 30 years ago after she had her first son  Reports she has had some hot flashes and chills but notes this is typically exacerbated by dietary intake    Patient Active Problem List   Diagnosis Date Noted   Obesity (BMI 30-39.9) 08/24/2021   Acquired hypothyroidism 08/24/2021   Chronic fatigue 01/16/2020   Anxiety 09/14/2019   Menopause 07/16/2019   Essential hypertension 07/16/2019   Osteoarthritis of spine with radiculopathy, thoracic region 05/24/2019    Past Surgical History:  Procedure Laterality Date   CESAREAN SECTION     LIPOMA EXCISION     Neck   TUBAL LIGATION     WISDOM TOOTH EXTRACTION      Family History  Problem Relation Age of Onset    Heart disease Maternal Grandmother    Hypertension Maternal Grandmother    Heart disease Maternal Grandfather    Hypertension Maternal Grandfather    Heart disease Paternal Grandmother    Hypertension Paternal Grandmother    Heart disease Paternal Grandfather    Heart attack Paternal Grandfather    Hypertension Paternal Grandfather    Hyperlipidemia Mother    Heart disease Father    Heart attack Father    Prostate cancer Father    Hypertension Father    Drug abuse Sister    Depression Sister    COPD Sister    Drug abuse Sister    Schizophrenia Sister    Personality disorder Sister     Social History   Tobacco Use   Smoking status: Former    Types: E-cigarettes    Quit date: 2010    Years since quitting: 13.8   Smokeless tobacco: Never  Substance Use Topics   Alcohol use: Not Currently     Current Outpatient Medications:    Cholecalciferol (VITAMIN D3 PO), Take by mouth., Disp: , Rfl:    hydrocortisone (ANUSOL-HC) 25 MG suppository, Place 1 suppository (25 mg total) rectally 2 (two) times daily., Disp: 12 suppository, Rfl: 6   NP THYROID 60 MG tablet, Take 1 tablet (60 mg total) by mouth daily., Disp: 90 tablet, Rfl: 3   phenazopyridine (PYRIDIUM) 200 MG  tablet, Take 1 tablet (200 mg total) by mouth 3 (three) times daily as needed for pain., Disp: 10 tablet, Rfl: 0   sulfamethoxazole-trimethoprim (BACTRIM DS) 800-160 MG tablet, Take 1 tablet by mouth 2 (two) times daily for 5 days., Disp: 10 tablet, Rfl: 0   TURMERIC PO, Take by mouth., Disp: , Rfl:   Allergies  Allergen Reactions   Cymbalta [Duloxetine Hcl] Other (See Comments)    Felt strange    I personally reviewed active problem list, medication list, allergies, notes from last encounter, lab results with the patient/caregiver today.   Review of Systems  Constitutional:  Positive for chills and diaphoresis. Negative for fever and malaise/fatigue.  Gastrointestinal:  Positive for diarrhea and nausea. Negative  for blood in stool and vomiting.  Genitourinary:  Positive for dysuria and flank pain.  Musculoskeletal:  Positive for back pain.  Neurological:  Negative for dizziness and headaches.      Objective  Vitals:   03/15/22 1310  BP: 117/78  Pulse: 87  Temp: 98.3 F (36.8 C)  SpO2: 98%  Weight: 214 lb 12.8 oz (97.4 kg)    Body mass index is 37.45 kg/m.  Physical Exam Vitals reviewed.  Constitutional:      Appearance: Normal appearance.  HENT:     Head: Normocephalic and atraumatic.  Cardiovascular:     Rate and Rhythm: Normal rate and regular rhythm.     Pulses: Normal pulses.     Heart sounds: Normal heart sounds.  Pulmonary:     Effort: Pulmonary effort is normal.  Abdominal:     General: Abdomen is flat. Bowel sounds are normal.     Palpations: Abdomen is soft.     Tenderness: There is no abdominal tenderness. There is no right CVA tenderness or left CVA tenderness.  Musculoskeletal:     Cervical back: Normal range of motion. No rigidity. Normal range of motion.     Right lower leg: No edema.     Left lower leg: No edema.  Neurological:     Mental Status: She is alert.      Recent Results (from the past 2160 hour(s))  Comprehensive metabolic panel     Status: Abnormal   Collection Time: 02/02/22  2:56 PM  Result Value Ref Range   Glucose 85 70 - 99 mg/dL   BUN 16 6 - 24 mg/dL   Creatinine, Ser 0.65 0.57 - 1.00 mg/dL   eGFR 105 >59 mL/min/1.73   BUN/Creatinine Ratio 25 (H) 9 - 23   Sodium 140 134 - 144 mmol/L   Potassium 4.0 3.5 - 5.2 mmol/L   Chloride 103 96 - 106 mmol/L   CO2 20 20 - 29 mmol/L   Calcium 9.5 8.7 - 10.2 mg/dL   Total Protein 6.9 6.0 - 8.5 g/dL   Albumin 4.6 3.8 - 4.9 g/dL   Globulin, Total 2.3 1.5 - 4.5 g/dL   Albumin/Globulin Ratio 2.0 1.2 - 2.2   Bilirubin Total 0.4 0.0 - 1.2 mg/dL   Alkaline Phosphatase 108 44 - 121 IU/L   AST 19 0 - 40 IU/L   ALT 30 0 - 32 IU/L  Lipid Panel w/o Chol/HDL Ratio     Status: Abnormal   Collection  Time: 02/02/22  2:56 PM  Result Value Ref Range   Cholesterol, Total 217 (H) 100 - 199 mg/dL   Triglycerides 146 0 - 149 mg/dL   HDL 47 >39 mg/dL   VLDL Cholesterol Cal 26 5 - 40 mg/dL  LDL Chol Calc (NIH) 144 (H) 0 - 99 mg/dL  VITAMIN D 25 Hydroxy (Vit-D Deficiency, Fractures)     Status: Abnormal   Collection Time: 02/02/22  2:56 PM  Result Value Ref Range   Vit D, 25-Hydroxy 20.5 (L) 30.0 - 100.0 ng/mL    Comment: Vitamin D deficiency has been defined by the [place] practice guideline as a level of serum 25-OH vitamin D less than 20 ng/mL (1,2). The Endocrine Society went on to further define vitamin D insufficiency as a level between 21 and 29 ng/mL (2). 1. IOM (Institute of Medicine). 2010. Dietary reference    intakes for calcium and D. [place]: The    [employer]. 2. Holick MF, Binkley Newbern, Bischoff-Ferrari HA, et al.    Evaluation, treatment, and prevention of vitamin D    deficiency: an Endocrine Society clinical practice    guideline. JCEM. 2011 Jul; 96(7):1911-30.   TSH     Status: None   Collection Time: 02/02/22  2:56 PM  Result Value Ref Range   TSH 2.470 0.450 - 4.500 uIU/mL  QuantiFERON-TB Gold Plus     Status: None   Collection Time: 02/19/22 11:02 AM  Result Value Ref Range   QuantiFERON Incubation Incubation performed.    QuantiFERON Criteria Comment     Comment: QuantiFERON-TB Gold Plus is a qualitative indirect test for M tuberculosis infection (including disease) and is intended for use in conjunction with risk assessment, radiography, and other medical and diagnostic evaluations. The QuantiFERON-TB Gold Plus result is determined by subtracting the Nil value from either TB antigen (Ag) value. The Mitogen tube serves as a control for the test.    QuantiFERON TB1 Ag Value 0.12 IU/mL   QuantiFERON TB2 Ag Value 0.20 IU/mL   QuantiFERON Nil Value 0.12 IU/mL   QuantiFERON Mitogen Value >10.00 IU/mL    QuantiFERON-TB Gold Plus Negative Negative    Comment: No response to M tuberculosis antigens detected. Infection with M tuberculosis is unlikely, but high risk individuals should be considered for additional testing (ATS/IDSA/CDC Clinical Practice Guidelines, 2017). The reference range is an Antigen minus Nil result of <0.35 IU/mL. Chemiluminescence immunoassay methodology   Hepatitis C Antibody     Status: None   Collection Time: 02/19/22 11:02 AM  Result Value Ref Range   Hep C Virus Ab Non Reactive Non Reactive    Comment: HCV antibody alone does not differentiate between previously resolved infection and active infection. Equivocal and Reactive HCV antibody results should be followed up with an HCV RNA test to support the diagnosis of active HCV infection.   HIV Antibody (routine testing w rflx)     Status: None   Collection Time: 02/19/22 11:02 AM  Result Value Ref Range   HIV Screen 4th Generation wRfx Non Reactive Non Reactive    Comment: HIV Negative HIV-1/HIV-2 antibodies and HIV-1 p24 antigen were NOT detected. There is no laboratory evidence of HIV infection.   Urinalysis, Routine w reflex microscopic     Status: Abnormal   Collection Time: 03/15/22  1:19 PM  Result Value Ref Range   Specific Gravity, UA >1.030 (H) 1.005 - 1.030   pH, UA 5.0 5.0 - 7.5   Color, UA Yellow Yellow   Appearance Ur Cloudy (A) Clear   Leukocytes,UA 1+ (A) Negative   Protein,UA Negative Negative/Trace   Glucose, UA Negative Negative   Ketones, UA Negative Negative   RBC, UA Negative Negative   Bilirubin, UA  Negative Negative   Urobilinogen, Ur 0.2 0.2 - 1.0 mg/dL   Nitrite, UA Negative Negative   Microscopic Examination See below:   Microscopic Examination     Status: Abnormal   Collection Time: 03/15/22  1:19 PM   Urine  Result Value Ref Range   WBC, UA 6-10 (A) 0 - 5 /hpf   RBC, Urine 0-2 0 - 2 /hpf   Epithelial Cells (non renal) 0-10 0 - 10 /hpf   Bacteria, UA None seen None  seen/Few     PHQ2/9:    02/19/2022   10:47 AM 11/24/2021    3:07 PM 08/24/2021    2:31 PM 02/17/2021    9:59 AM 12/29/2020    9:32 AM  Depression screen PHQ 2/9  Decreased Interest 0 0 0 0 0  Down, Depressed, Hopeless 0 0 0 0 0  PHQ - 2 Score 0 0 0 0 0  Altered sleeping 0 1 0 0   Tired, decreased energy 0 2 3 0   Change in appetite 0 3 3 0   Feeling bad or failure about yourself  0 1 0 0   Trouble concentrating 0 0 0 0   Moving slowly or fidgety/restless 0 0 0 0   Suicidal thoughts 0 0 0 0   PHQ-9 Score 0 7 6 0   Difficult doing work/chores  Not difficult at all         Fall Risk:    02/19/2022   10:47 AM 12/29/2020    9:32 AM 05/24/2019   10:06 AM  Fall Risk   Falls in the past year? 0 0 0  Number falls in past yr: 0 0 0  Injury with Fall? 0 0 0  Risk for fall due to : No Fall Risks No Fall Risks   Follow up Falls evaluation completed Falls evaluation completed       Functional Status Survey:      Assessment & Plan  Problem List Items Addressed This Visit   None Visit Diagnoses     Low back pain without sciatica, unspecified back pain laterality, unspecified chronicity    -  Primary Acute, ongoing Suspect this could be secondary to ongoing urinary symptoms KUB ordered to assist with stone rule out Will also treat for apparent UTI with Bactrim  Follow up as needed for persistent or progressing symptoms     Relevant Orders   Urinalysis, Routine w reflex microscopic (Completed)   DG Abd 1 View   Dysuria     UA is consistent with UTI- results provided to patient during apt and Abx sent in for management pending culture results.     Relevant Medications   phenazopyridine (PYRIDIUM) 200 MG tablet   Other Relevant Orders   Microscopic Examination (Completed)   DG Abd 1 View   Urine Culture   Urinary tract infection without hematuria, site unspecified     Acute, new concern Patient reports symptoms comprised of the following: dysuria, pressure, flank  pain  Results of UA are consistent with UTI - urine sample sent for culture to determine causative organism and susceptibility- results to dictate further management  Recommend starting Bactrim at this time while awaiting culture Will provide script - discussed importance of finishing entire course of abx and staying well hydrated while recovering from UTI Will also provide script for Pyridium to assist with pain Given unilateral flank pain I will also order KUB to help rule out nephrolithiasis  Reviewed ED precautions with patient  Follow up as needed for persistent or worsening symptoms    Relevant Medications   sulfamethoxazole-trimethoprim (BACTRIM DS) 800-160 MG tablet   phenazopyridine (PYRIDIUM) 200 MG tablet   Other Relevant Orders   Microscopic Examination (Completed)        No follow-ups on file.   I, Siddarth Hsiung E Iviona Hole, PA-C, have reviewed all documentation for this visit. The documentation on 03/15/22 for the exam, diagnosis, procedures, and orders are all accurate and complete.   Talitha Givens, MHS, PA-C [facility]

## 2022-03-17 ENCOUNTER — Ambulatory Visit
Admission: RE | Admit: 2022-03-17 | Discharge: 2022-03-17 | Disposition: A | Discharge status: Home / Self Care | Payer: BC Managed Care – PPO | Attending: Physician Assistant | Admitting: Physician Assistant

## 2022-03-17 ENCOUNTER — Ambulatory Visit
Admission: RE | Admit: 2022-03-17 | Discharge: 2022-03-17 | Disposition: A | Discharge status: Home / Self Care | Payer: BC Managed Care – PPO | Source: Ambulatory Visit | Attending: Physician Assistant | Admitting: Physician Assistant

## 2022-03-17 DIAGNOSIS — R300 Dysuria: Secondary | ICD-10-CM

## 2022-03-17 DIAGNOSIS — R109 Unspecified abdominal pain: Secondary | ICD-10-CM | POA: Diagnosis not present

## 2022-03-17 DIAGNOSIS — M545 Low back pain, unspecified: Secondary | ICD-10-CM | POA: Insufficient documentation

## 2022-03-18 DIAGNOSIS — N390 Urinary tract infection, site not specified: Secondary | ICD-10-CM | POA: Diagnosis not present

## 2022-03-18 DIAGNOSIS — R300 Dysuria: Secondary | ICD-10-CM | POA: Diagnosis not present

## 2022-03-20 LAB — URINE CULTURE

## 2022-03-22 MED ORDER — CEFDINIR 300 MG PO CAPS: 300.0000 mg | ORAL_CAPSULE | Freq: Two times a day (BID) | ORAL | 0 refills | Status: AC

## 2022-03-22 NOTE — Addendum Note (Signed)
Addended by: Talitha Givens on: 03/22/2022 08:36 AM   Modules accepted: Orders

## 2022-03-25 IMAGING — CT CT CHEST W/O CM
1 series · 15 of 34 positions shown, 19 images · non-contrast
Comparison: Chest x-ray from 09/17/2019

CLINICAL DATA: Small nodular density on recent chest x-ray
examination.

EXAM:
CT CHEST WITHOUT CONTRAST
TECHNIQUE: Multidetector CT imaging of the chest was performed following the
standard protocol without IV contrast.

[Series 2: thorax · axial · 0.67mm/px · z∈[-645,-381]mm · 15 of 156 slices shown, 19 images]
[im 12/156  mediastinal]
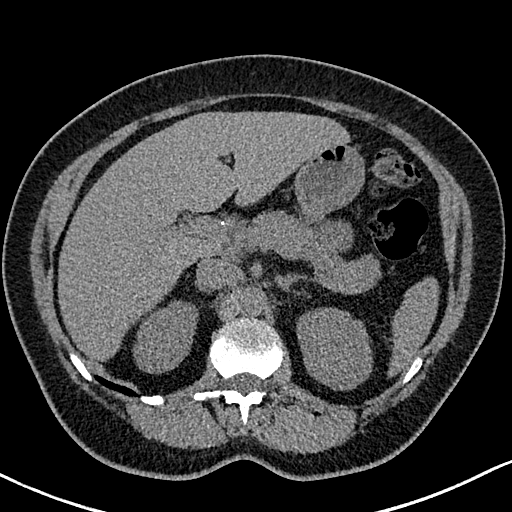
[im 12/156  lung]
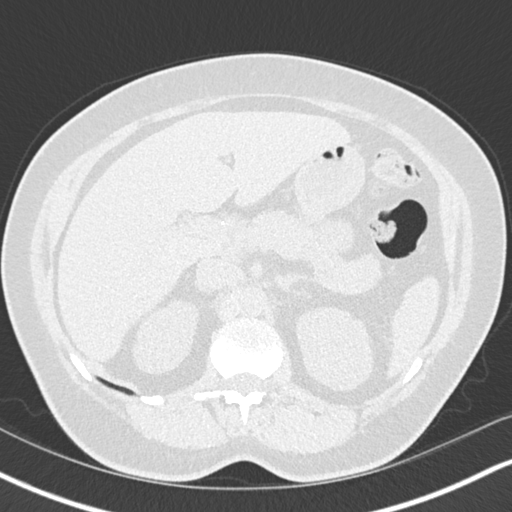
[im 23/156  lung]
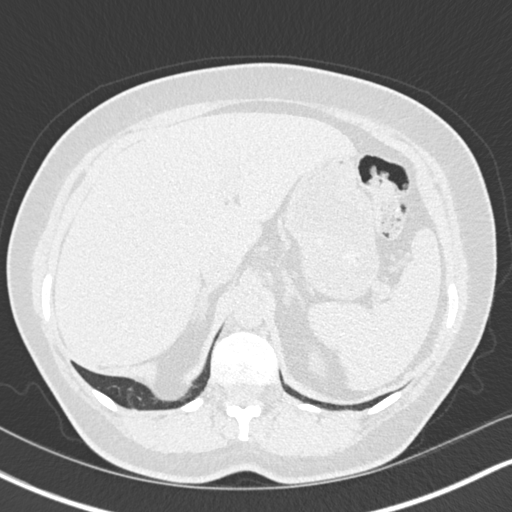
[im 32/156  lung]
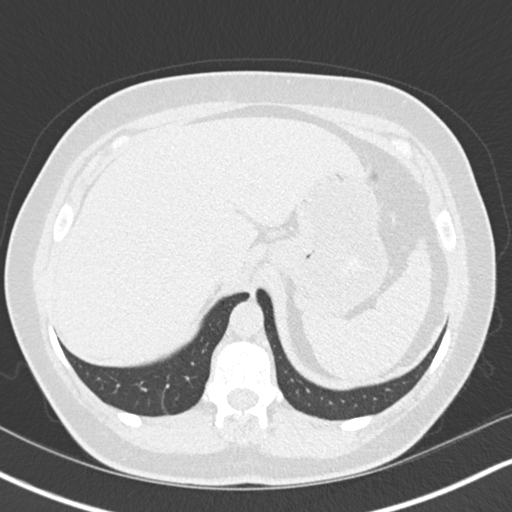
[im 41/156  lung]
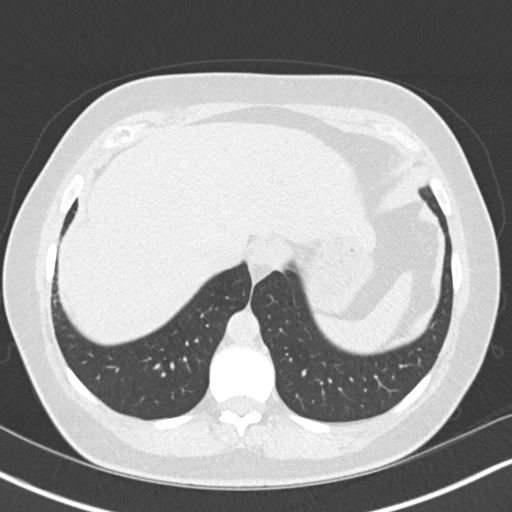
[im 52/156  mediastinal]
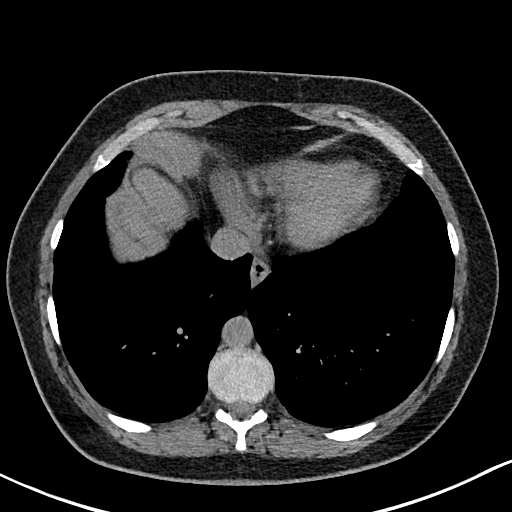
[im 52/156  lung]
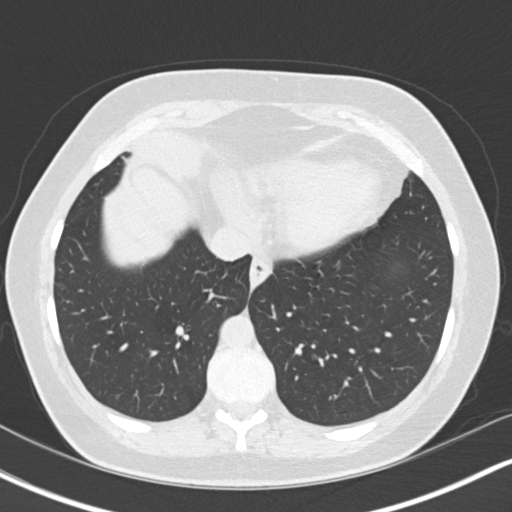
[im 63/156  lung]
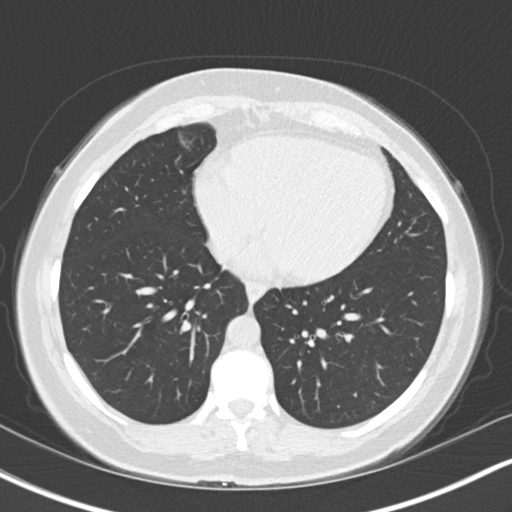
[im 69/156  lung]
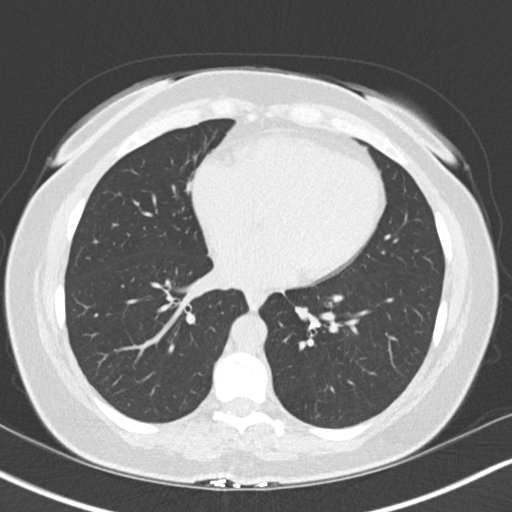
[im 81/156  lung]
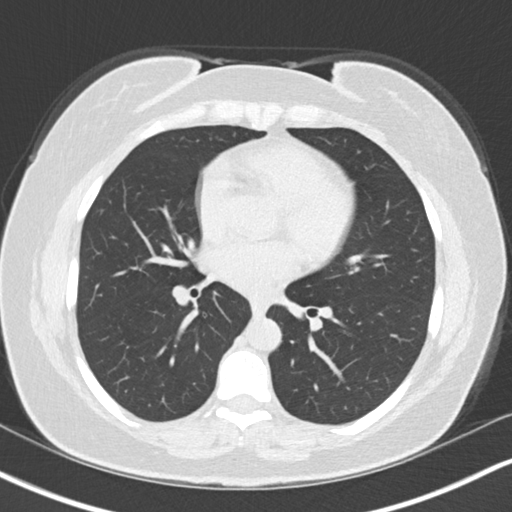
[im 87/156  mediastinal]
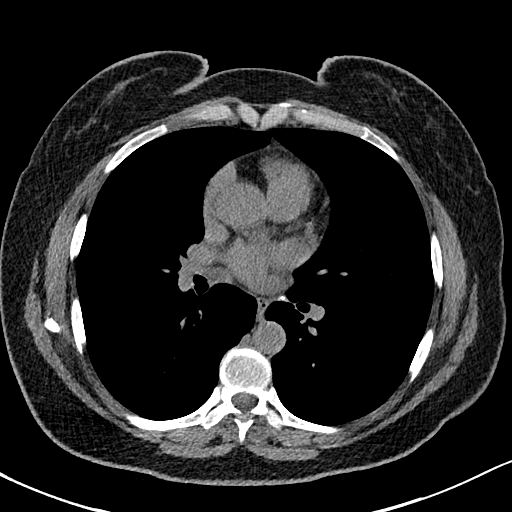
[im 87/156  lung]
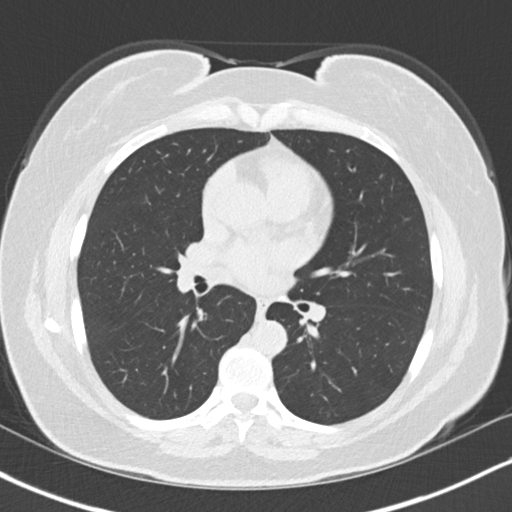
[im 94/156  lung]
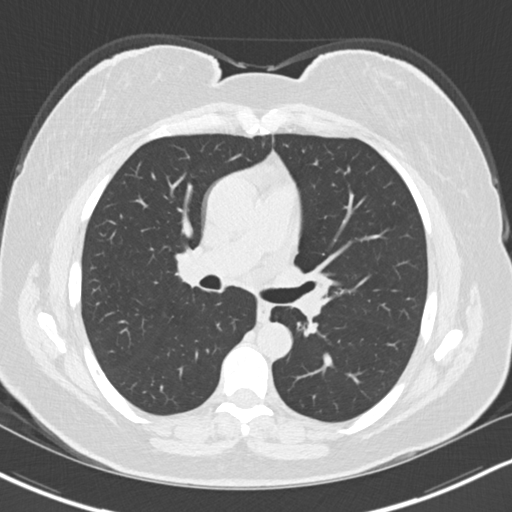
[im 104/156  lung]
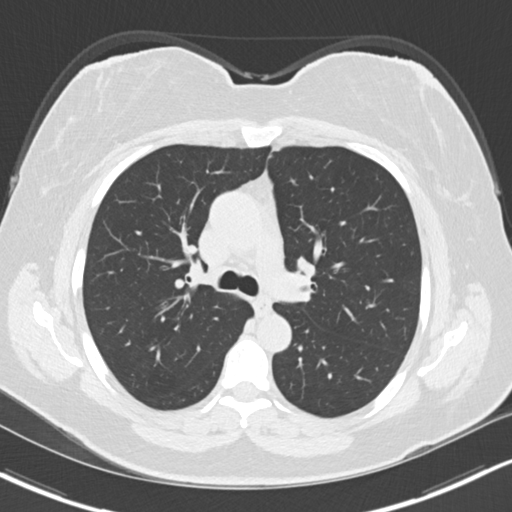
[im 115/156  lung]
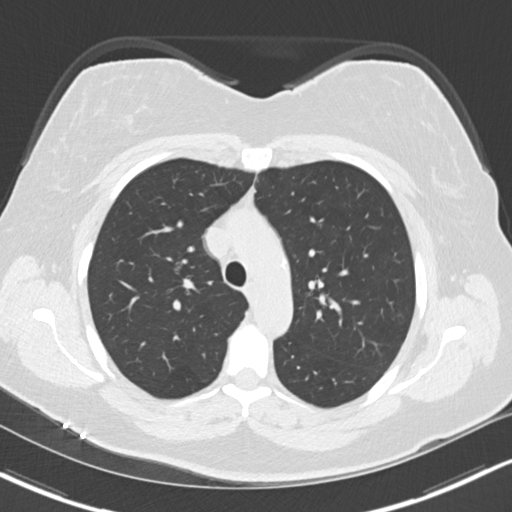
[im 125/156  mediastinal]
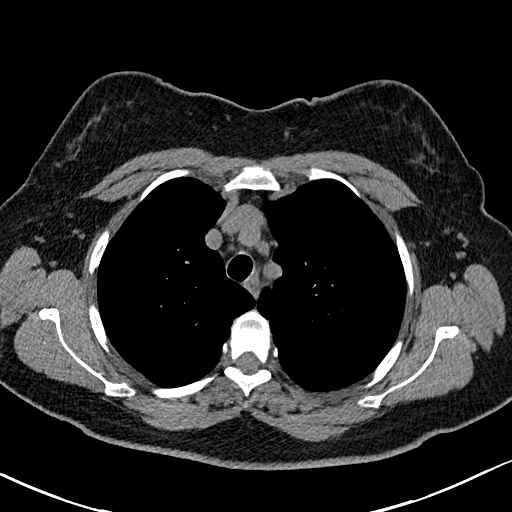
[im 125/156  lung]
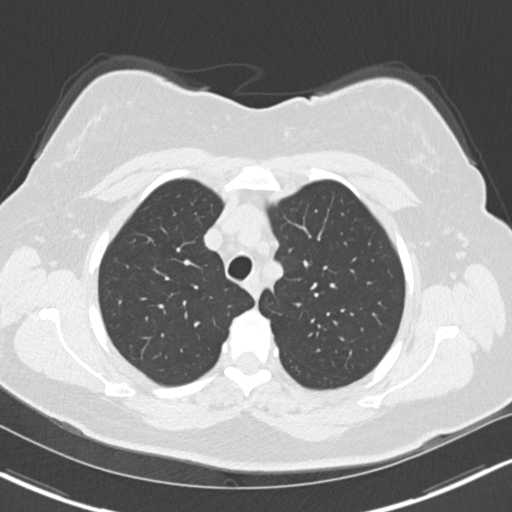
[im 133/156  lung]
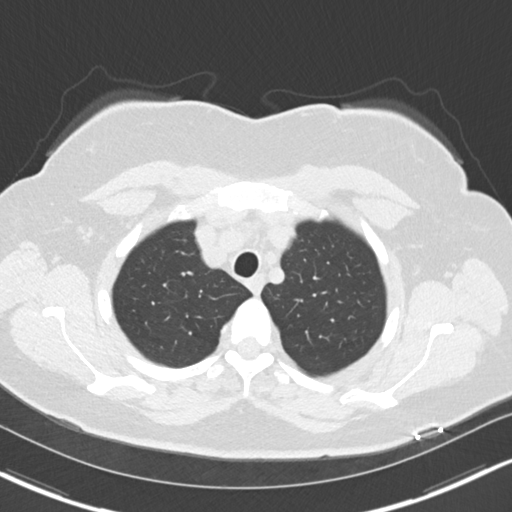
[im 144/156  lung]
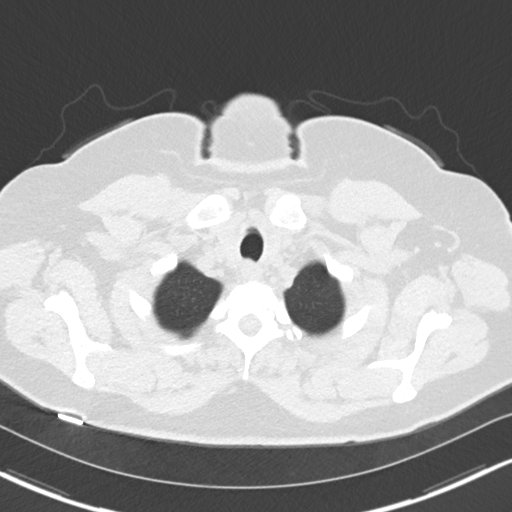

[15 of 34 positions shown; findings below may reference images not displayed]

FINDINGS: Cardiovascular: Somewhat limited due to lack of IV contrast. No
cardiac enlargement is seen. No pericardial effusion is noted. No
coronary calcifications are seen. Mild thoracic aortic calcification
is seen without aneurysmal dilatation.

Mediastinum/Nodes: Thoracic inlet is within normal limits. No
sizable hilar or mediastinal adenopathy is noted. The esophagus as
visualized is within normal limits.

Lungs/Pleura: The lungs are well aerated bilaterally. No focal
infiltrate or sizable effusion is seen. Calcified granuloma is noted
in the left lower lobe which corresponds to the density seen on
recent plain film.

Upper Abdomen: Visualized upper abdomen demonstrates a single
calcified granuloma within the liver.

Musculoskeletal: No chest wall mass or suspicious bone lesions
identified.
IMPRESSION: Changes consistent with prior granulomatous disease. The left lower
lobe granuloma corresponds to the nodule found on previous chest
x-ray.

No other focal abnormality is noted.

## 2022-04-01 ENCOUNTER — Encounter: Payer: Self-pay | Admitting: Family Medicine

## 2022-09-21 DIAGNOSIS — M9901 Segmental and somatic dysfunction of cervical region: Secondary | ICD-10-CM | POA: Diagnosis not present

## 2022-09-21 DIAGNOSIS — M9902 Segmental and somatic dysfunction of thoracic region: Secondary | ICD-10-CM | POA: Diagnosis not present

## 2022-09-21 DIAGNOSIS — M542 Cervicalgia: Secondary | ICD-10-CM | POA: Diagnosis not present

## 2022-09-21 DIAGNOSIS — R519 Headache, unspecified: Secondary | ICD-10-CM | POA: Diagnosis not present

## 2022-09-29 DIAGNOSIS — M9902 Segmental and somatic dysfunction of thoracic region: Secondary | ICD-10-CM | POA: Diagnosis not present

## 2022-09-29 DIAGNOSIS — R519 Headache, unspecified: Secondary | ICD-10-CM | POA: Diagnosis not present

## 2022-09-29 DIAGNOSIS — M542 Cervicalgia: Secondary | ICD-10-CM | POA: Diagnosis not present

## 2022-09-29 DIAGNOSIS — M9901 Segmental and somatic dysfunction of cervical region: Secondary | ICD-10-CM | POA: Diagnosis not present

## 2022-10-05 DIAGNOSIS — R519 Headache, unspecified: Secondary | ICD-10-CM | POA: Diagnosis not present

## 2022-10-05 DIAGNOSIS — M542 Cervicalgia: Secondary | ICD-10-CM | POA: Diagnosis not present

## 2022-10-05 DIAGNOSIS — M9902 Segmental and somatic dysfunction of thoracic region: Secondary | ICD-10-CM | POA: Diagnosis not present

## 2022-10-05 DIAGNOSIS — M9901 Segmental and somatic dysfunction of cervical region: Secondary | ICD-10-CM | POA: Diagnosis not present

## 2022-10-19 DIAGNOSIS — M542 Cervicalgia: Secondary | ICD-10-CM | POA: Diagnosis not present

## 2022-10-19 DIAGNOSIS — M9902 Segmental and somatic dysfunction of thoracic region: Secondary | ICD-10-CM | POA: Diagnosis not present

## 2022-10-19 DIAGNOSIS — M9901 Segmental and somatic dysfunction of cervical region: Secondary | ICD-10-CM | POA: Diagnosis not present

## 2022-10-19 DIAGNOSIS — R519 Headache, unspecified: Secondary | ICD-10-CM | POA: Diagnosis not present

## 2022-11-09 DIAGNOSIS — M9901 Segmental and somatic dysfunction of cervical region: Secondary | ICD-10-CM | POA: Diagnosis not present

## 2022-11-09 DIAGNOSIS — R519 Headache, unspecified: Secondary | ICD-10-CM | POA: Diagnosis not present

## 2022-11-09 DIAGNOSIS — M542 Cervicalgia: Secondary | ICD-10-CM | POA: Diagnosis not present

## 2022-11-09 DIAGNOSIS — M9902 Segmental and somatic dysfunction of thoracic region: Secondary | ICD-10-CM | POA: Diagnosis not present

## 2022-11-17 DIAGNOSIS — E039 Hypothyroidism, unspecified: Secondary | ICD-10-CM | POA: Diagnosis not present

## 2022-12-08 ENCOUNTER — Telehealth: Payer: Self-pay

## 2022-12-08 NOTE — Telephone Encounter (Signed)
-----   Message from Olevia Perches sent at 12/06/2022  5:09 PM EDT ----- Please schedule mammo

## 2022-12-08 NOTE — Telephone Encounter (Signed)
Called and spoke to patient. Patient states she does not want to have a mammogram and refuses at this time.

## 2023-05-09 ENCOUNTER — Telehealth (INDEPENDENT_AMBULATORY_CARE_PROVIDER_SITE_OTHER): Payer: BC Managed Care – PPO | Admitting: Physician Assistant

## 2023-05-09 DIAGNOSIS — J028 Acute pharyngitis due to other specified organisms: Secondary | ICD-10-CM | POA: Diagnosis not present

## 2023-05-09 DIAGNOSIS — B084 Enteroviral vesicular stomatitis with exanthem: Secondary | ICD-10-CM

## 2023-05-09 DIAGNOSIS — B9789 Other viral agents as the cause of diseases classified elsewhere: Secondary | ICD-10-CM

## 2023-05-09 MED ORDER — LIDOCAINE VISCOUS HCL 2 % MT SOLN: 15.0000 mL | OROMUCOSAL | 0 refills | Status: DC | PRN

## 2023-05-09 NOTE — Progress Notes (Signed)
 Virtual Visit via Video Note  I connected with Julia Jenkins on 05/09/23 at 10:20 AM EST by a video enabled telemedicine application and verified that I am speaking with the correct person using two identifiers.   Today's Provider: [place], MHS, PA-C Introduced myself to the patient as a PA-C and provided education on APPs in clinical practice.   Location: Patient: At home Provider: [facility], [place], [place]    I discussed the limitations of evaluation and management by telemedicine and the availability of in person appointments. The patient expressed understanding and agreed to proceed.  Chief Complaint  Patient presents with   Cough    x5 days, non-productive.   Sore Throat    x5 days. Took OTC Naproxen . Unable to swallow    History of Present Illness:   URI -type symptoms  Onset: sudden  Duration: started on Thurs with sore throat  Associated symptoms: she reports increasingly worse sore throat, dry cough, lymphadenopathy  She reports some SOB due to postnasal drainage and coughing yesterday  She reports subjective fever but states her chills seem to have improved since the weekend   She denies rashes or oral lesions that she has observed  She denies sharing utensils, foods, drinks  Intervention: she has been taking Naproxen     Recent sick contacts: One of her grandchildren has hand, foot and mouth - grandchild lives with her  Her husband has also started to have similar symptoms   Recent travel: none  COVID testing at home: has not tested at home   Result:   Review of Systems  Constitutional:  Positive for chills, fever and malaise/fatigue.  HENT:  Positive for congestion and sore throat.   Respiratory:  Positive for cough. Negative for sputum production, shortness of breath and wheezing.   Gastrointestinal:  Negative for diarrhea, nausea and vomiting.  Musculoskeletal:  Negative for myalgias.  Neurological:  Negative for headaches.     Observations/Objective:  Due to the nature of the virtual visit, physical exam and observations are limited. Able to obtain the following observations:   Alert, oriented, x3 Appears comfortable, in no acute distress.  No scleral injection, tachypnea, wheeze or strider. Able to maintain conversation without visible strain.   cough appreciated during visit.  Patient sounds hoarse   Assessment and Plan:  Problem List Items Addressed This Visit   None Visit Diagnoses       Hand, foot and mouth disease    -  Primary   Relevant Medications   lidocaine  (XYLOCAINE ) 2 % solution     Sore throat (viral)       Relevant Medications   lidocaine  (XYLOCAINE ) 2 % solution      Acute, new concern She reports severe sore throat, fatigue, coughing that is not improving with Naproxen  She reports one of her grandchildren that lives with her has HFM currently and others in the home have developed similar symptoms to patient Suspect viral etiology at this time but cannot definitively rule out strep pharyngitis without testing- patient declines this today Will treat presumptively for hand, foot, mouth for now and reviewed return precautions Recommend antipyretics and analgesia as tolerated - will send in viscous lidocaine  for assistance with sore throat Reviewed ED and return precautions along with expected disease course Follow up as needed for persistent or progressing symptoms     Follow Up Instructions:    I discussed the assessment and treatment plan with the patient. The patient was provided an opportunity to  ask questions and all were answered. The patient agreed with the plan and demonstrated an understanding of the instructions.   The patient was advised to call back or seek an in-person evaluation if the symptoms worsen or if the condition fails to improve as anticipated.  I provided 11 minutes of non-face-to-face time during this encounter.  No follow-ups on file.   I, Karenann Mcgrory  E Lucienne Sawyers, PA-C, have reviewed all documentation for this visit. The documentation on 05/09/23 for the exam, diagnosis, procedures, and orders are all accurate and complete.   Rocky Mt, MHS, PA-C [facility]

## 2023-05-09 NOTE — Addendum Note (Signed)
 Addended by: Jacquelin Hawking on: 05/09/2023 01:32 PM   Modules accepted: Level of Service

## 2023-05-11 ENCOUNTER — Ambulatory Visit: Payer: BC Managed Care – PPO | Admitting: Nurse Practitioner

## 2023-06-10 ENCOUNTER — Encounter: Payer: Self-pay | Admitting: Nurse Practitioner

## 2023-06-10 ENCOUNTER — Ambulatory Visit: Payer: BC Managed Care – PPO | Admitting: Nurse Practitioner

## 2023-06-10 VITALS — BP 133/85 | HR 70 | Ht 63.0 in | Wt 206.2 lb

## 2023-06-10 DIAGNOSIS — D170 Benign lipomatous neoplasm of skin and subcutaneous tissue of head, face and neck: Secondary | ICD-10-CM

## 2023-06-10 NOTE — Progress Notes (Signed)
 BP 133/85 (BP Location: Left Arm, Patient Position: Sitting, Cuff Size: Large)   Pulse 70   Ht 5' 3 (1.6 m)   Wt 206 lb 3.2 oz (93.5 kg)   SpO2 100%   BMI 36.53 kg/m    Subjective:    Patient ID: Julia Jenkins, female    DOB: 01/28/1971, 53 y.o.   MRN: [id number]  HPI: Julia Jenkins is a 53 y.o. female  Chief Complaint  Patient presents with   Lipoma    Left side of neck, has had removed 2x already, 2018 was the date of last removal    Patient presents to clinic with complaints of a lipoma that has reoccurred.  States she has had it removed twice and believes the last time was in 2018.  It has been growing. She states it has been growing.  Rubs with the seat belt.  Doesn't have pain but more annoying.     Relevant past medical, surgical, family and social history reviewed and updated as indicated. Interim medical history since our last visit reviewed. Allergies and medications reviewed and updated.  Review of Systems  Skin:        Lipoma of neck    Per HPI unless specifically indicated above     Objective:    BP 133/85 (BP Location: Left Arm, Patient Position: Sitting, Cuff Size: Large)   Pulse 70   Ht 5' 3 (1.6 m)   Wt 206 lb 3.2 oz (93.5 kg)   SpO2 100%   BMI 36.53 kg/m   Wt Readings from Last 3 Encounters:  06/10/23 206 lb 3.2 oz (93.5 kg)  03/15/22 214 lb 12.8 oz (97.4 kg)  02/19/22 211 lb 11.2 oz (96 kg)    Physical Exam Vitals and nursing note reviewed.  Constitutional:      General: She is not in acute distress.    Appearance: Normal appearance. She is normal weight. She is not ill-appearing, toxic-appearing or diaphoretic.  HENT:     Head: Normocephalic.     Right Ear: External ear normal.     Left Ear: External ear normal.     Nose: Nose normal.     Mouth/Throat:     Mouth: Mucous membranes are moist.     Pharynx: Oropharynx is clear.  Eyes:     General:        Right eye: No discharge.        Left eye: No discharge.     Extraocular Movements:  Extraocular movements intact.     Conjunctiva/sclera: Conjunctivae normal.     Pupils: Pupils are equal, round, and reactive to light.  Cardiovascular:     Rate and Rhythm: Normal rate and regular rhythm.     Heart sounds: No murmur heard. Pulmonary:     Effort: Pulmonary effort is normal. No respiratory distress.     Breath sounds: Normal breath sounds. No wheezing or rales.  Musculoskeletal:     Cervical back: Normal range of motion and neck supple.  Skin:    General: Skin is warm and dry.     Capillary Refill: Capillary refill takes less than 2 seconds.       Neurological:     General: No focal deficit present.     Mental Status: She is alert and oriented to person, place, and time. Mental status is at baseline.  Psychiatric:        Mood and Affect: Mood normal.        Behavior: Behavior normal.  Thought Content: Thought content normal.        Judgment: Judgment normal.     Results for orders placed or performed in visit on 03/15/22  Microscopic Examination   Collection Time: 03/15/22  1:19 PM   Urine  Result Value Ref Range   WBC, UA 6-10 (A) 0 - 5 /hpf   RBC, Urine 0-2 0 - 2 /hpf   Epithelial Cells (non renal) 0-10 0 - 10 /hpf   Bacteria, UA None seen None seen/Few  Urinalysis, Routine w reflex microscopic   Collection Time: 03/15/22  1:19 PM  Result Value Ref Range   Specific Gravity, UA >1.030 (H) 1.005 - 1.030   pH, UA 5.0 5.0 - 7.5   Color, UA Yellow Yellow   Appearance Ur Cloudy (A) Clear   Leukocytes,UA 1+ (A) Negative   Protein,UA Negative Negative/Trace   Glucose, UA Negative Negative   Ketones, UA Negative Negative   RBC, UA Negative Negative   Bilirubin, UA Negative Negative   Urobilinogen, Ur 0.2 0.2 - 1.0 mg/dL   Nitrite, UA Negative Negative   Microscopic Examination See below:   Urine Culture   Collection Time: 03/18/22  3:50 PM   Specimen: Urine   UR  Result Value Ref Range   Urine Culture, Routine Final report (A)    Organism ID,  Bacteria Comment (A)    ORGANISM ID, BACTERIA Comment       Assessment & Plan:   Problem List Items Addressed This Visit   None Visit Diagnoses       Lipoma of neck    -  Primary   Referral placed for patient to see General Surgery.   Relevant Orders   Ambulatory referral to General Surgery        Follow up plan: Return if symptoms worsen or fail to improve.

## 2023-06-14 IMAGING — DX DG KNEE COMPLETE 4+V*R*
4 series · 4 of 4 positions shown · non-contrast
Comparison: None.

CLINICAL DATA: Right knee pain.

EXAM:
RIGHT KNEE - COMPLETE 4+ VIEW

[knee ap]
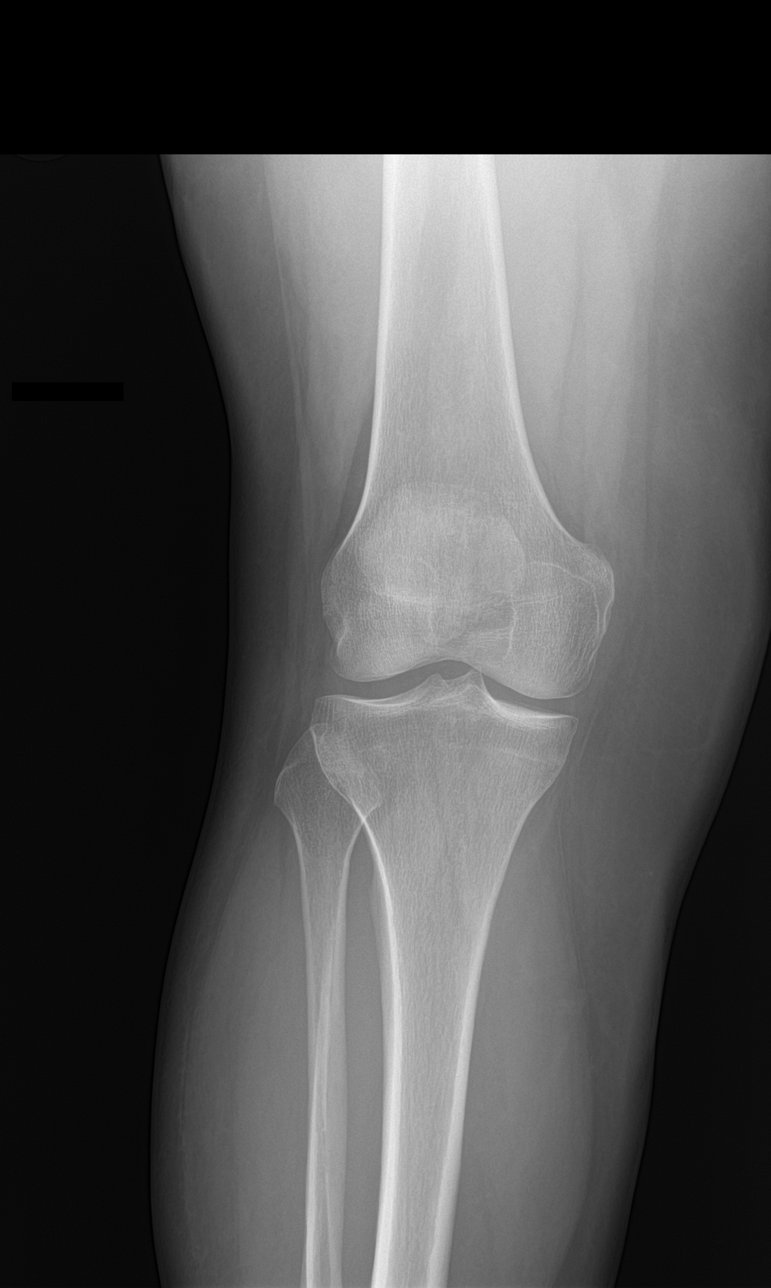

[knee tunnel]
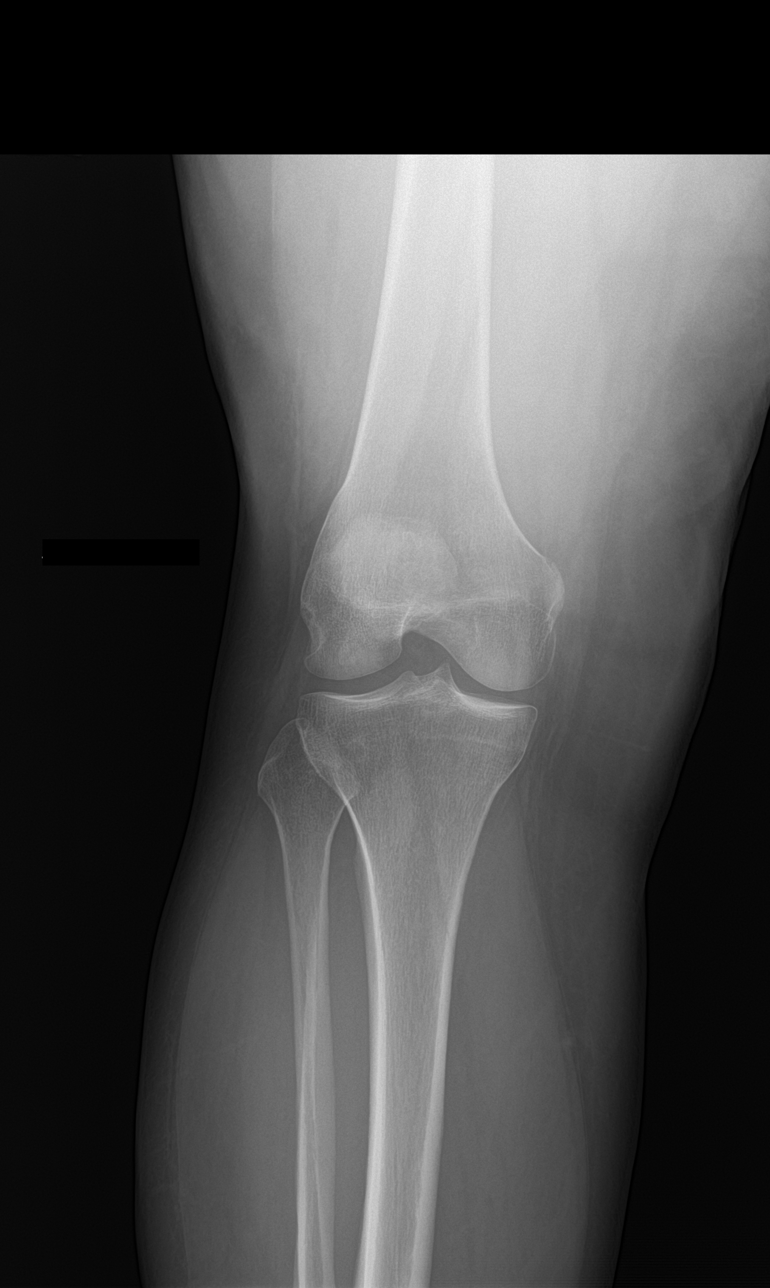

[knee lat]
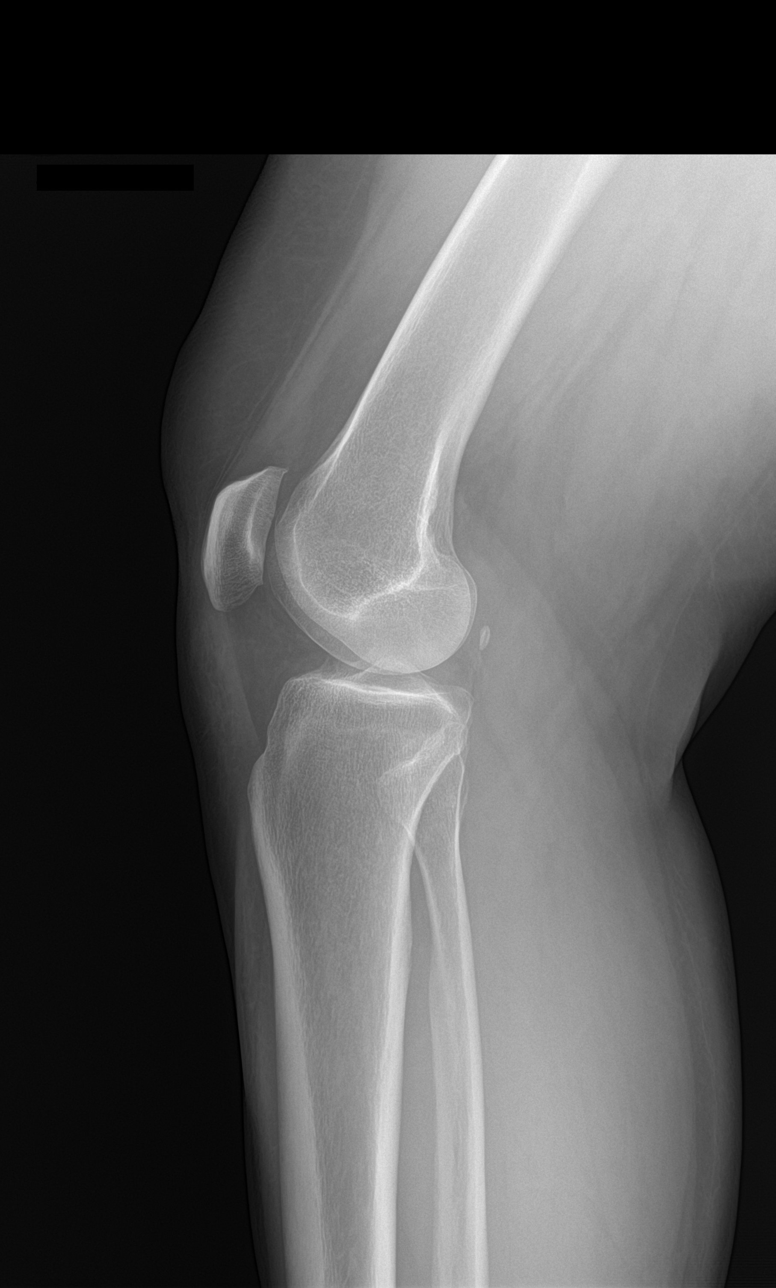

[patella skyline]
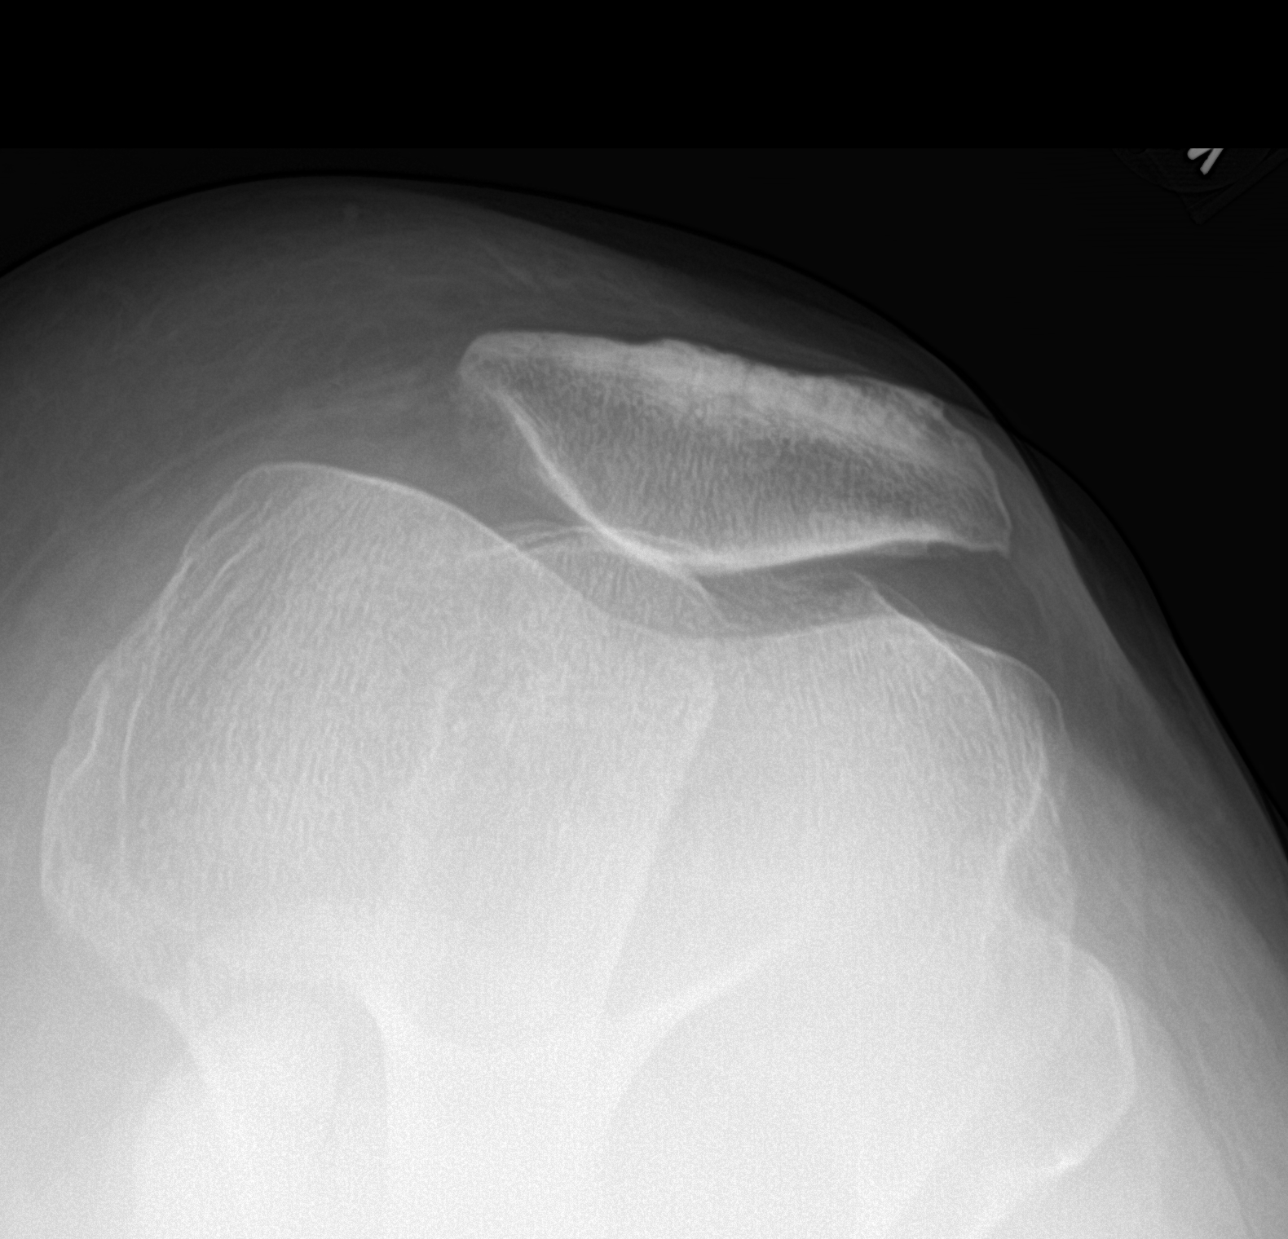

[4 of 4 positions shown; findings below may reference images not displayed]

FINDINGS: No evidence of acute fracture or dislocation. No evidence of
arthropathy or other focal bone abnormality. A very small joint
effusion is noted.
IMPRESSION: 1. No acute osseous abnormality.
2. Very small joint effusion.

## 2023-06-16 ENCOUNTER — Encounter: Payer: Self-pay | Admitting: General Surgery

## 2023-06-16 ENCOUNTER — Ambulatory Visit (INDEPENDENT_AMBULATORY_CARE_PROVIDER_SITE_OTHER): Payer: Self-pay | Admitting: General Surgery

## 2023-06-16 VITALS — BP 133/80 | HR 87 | Temp 98.5°F | Ht 64.0 in | Wt 203.0 lb

## 2023-06-16 DIAGNOSIS — D170 Benign lipomatous neoplasm of skin and subcutaneous tissue of head, face and neck: Secondary | ICD-10-CM

## 2023-06-16 NOTE — Progress Notes (Signed)
Patient ID: Julia Jenkins, female   DOB: Sep 15, 1970, 53 y.o.   MRN: [id number] CC: Left Neck Mass History of Present Illness Julia Jenkins is a 53 y.o. female with past medical history of left neck lipoma who presents in consultation for left neck mass.  The patient reports that she has had a lipoma removed from her left neck twice before.  The last time was in 2018.  She says that over the last 6 months she has noticed a growth in her left neck.  She says that the growth is soft but has been growing quite quickly over the last 6 months.  She denies any overlying skin changes or drainage from the mass.  She says that from her previous resection she has some numbness over her left shoulder.  She denies any unexplained weight loss or fatigue or night sweats.  Past Medical History Past Medical History:  Diagnosis Date   Hypertension    Lipoma of neck        Past Surgical History:  Procedure Laterality Date   CESAREAN SECTION     LIPOMA EXCISION     Neck   TUBAL LIGATION     WISDOM TOOTH EXTRACTION      Allergies  Allergen Reactions   Cymbalta [Duloxetine Hcl] Other (See Comments)    Felt strange    Current Outpatient Medications  Medication Sig Dispense Refill   Ascorbic Acid (VITAMIN C) 1000 MG tablet Take 1,000 mg by mouth daily.     Cholecalciferol (VITAMIN D3 PO) Take by mouth.     Multiple Vitamins-Minerals (ZINC PO) Take by mouth.     No current facility-administered medications for this visit.    Family History Family History  Problem Relation Age of Onset   Heart disease Maternal Grandmother    Hypertension Maternal Grandmother    Heart disease Maternal Grandfather    Hypertension Maternal Grandfather    Heart disease Paternal Grandmother    Hypertension Paternal Grandmother    Heart disease Paternal Grandfather    Heart attack Paternal Grandfather    Hypertension Paternal Grandfather    Hyperlipidemia Mother    Heart disease Father    Heart attack Father    Prostate  cancer Father    Hypertension Father    Drug abuse Sister    Depression Sister    COPD Sister    Drug abuse Sister    Schizophrenia Sister    Personality disorder Sister        Social History Social History   Tobacco Use   Smoking status: Former    Types: E-cigarettes    Quit date: 2010    Years since quitting: 15.1    Passive exposure: Past   Smokeless tobacco: Never  Vaping Use   Vaping status: Never Used  Substance Use Topics   Alcohol use: Not Currently   Drug use: Never    Works as a Investment banker, operational and is in the process of adopting 2 of her grandchildren   ROS Full ROS of systems performed and is otherwise negative there than what is stated in the HPI  Physical Exam Blood pressure 133/80, pulse 87, temperature 98.5 F (36.9 C), height 5\' 4"  (1.626 m), weight 203 lb (92.1 kg), SpO2 98%.  And oriented x 3, normal work of breathing on room air, regular rate and rhythm, moving all extremities spontaneously, mood and affect appropriate, left neck there is a large soft, mobile mass.  There is an overlying scar that is well-healed.  There  is no drainage from the mass.  There is minimal pain with palpation of the mass. Data Reviewed Labs and medicines reviewed and referred here for left neck mass  I have personally reviewed the patient's imaging and medical records.    Assessment/Plan    Patient referred here for left neck mass that is likely a lipoma given she has had resection of lipoma at the site twice before.  Given the high rate of increase in size over the last 6 months I do think that we should get a ultrasound to ensure that there is no malignant appearing features.  This will also help with operative planning.  If there are no concerning features then we can proceed to the operating room for left neck mass excision.  I discussed the risk, benefits alternatives of the procedure including risk of infection, bleeding, recurrence, damage to underlying vasculature and nerve  structures.  She understands these risks and wishes to proceed.     Kandis Cocking 06/16/2023, 10:13 AM

## 2023-06-16 NOTE — Patient Instructions (Addendum)
We would like for you to have an Ultrasound of this area before surgery so we can look a this area better. You may call to schedule this at your convenience [phone].  We have spoken today about removing a lump in your neck. This will be done by Dr. Maurine Minister at [facility].  Please see your Blue surgery sheet for more information. Our surgery scheduler will call you to look at surgery dates and to go over information.   If you have FMLA or Disability paperwork that needs to be filled out, please have your company fax your paperwork to [phone] or you may drop this by either office. This paperwork will be filled out within 3 days after your surgery has been completed.  Call to report any excessive bleeding, spreading redness, or increased pain.    Lipoma Removal Lipoma removal is a surgical procedure to remove a noncancerous (benign) tumor that is made up of fat cells (lipoma). Most lipomas are small and painless and do not require treatment. They can form in many areas of the body but are most common under the skin of the back, shoulders, arms, and thighs. You may need lipoma removal if you have a lipoma that is large, growing, or causing discomfort. Lipoma removal may also be done for cosmetic reasons. Tell a health care provider about: Any allergies you have. All medicines you are taking, including vitamins, herbs, eye drops, creams, and over-the-counter medicines. Any problems you or family members have had with anesthetic medicines. Any blood disorders you have. Any surgeries you have had. Any medical conditions you have. Whether you are pregnant or may be pregnant. What are the risks? Generally, this is a safe procedure. However, problems may occur, including: Infection. Bleeding. Allergic reactions to medicines. Damage to nerves or blood vessels near the lipoma. Scarring.  Medicines Ask your health care provider about: Changing or stopping your regular medicines. This is  especially important if you are taking diabetes medicines or blood thinners. Taking medicines such as aspirin and ibuprofen. These medicines can thin your blood. Do not take these medicines before your procedure if your health care provider instructs you not to. You may be given antibiotic medicine to help prevent infection. General instructions Ask your health care provider how your surgical site will be marked or identified. You will have a physical exam. Your health care provider will check the size of the lipoma and whether it can be moved easily.  What happens during the procedure? To reduce your risk of infection: Your health care team will wash or sanitize their hands. Your skin will be washed with surgical soap. You will be given the following: A medicine to numb the area (local anesthetic). An incision will be made over the lipoma or very near the lipoma. The incision may be made in a natural skin line or crease. Tissues, nerves, and blood vessels near the lipoma will be moved out of the way. The lipoma and the capsule that surrounds it will be separated from the surrounding tissues. The lipoma will be removed. The incision may be closed with stitches and surgical glue

## 2023-06-17 ENCOUNTER — Telehealth: Payer: Self-pay | Admitting: General Surgery

## 2023-06-17 NOTE — Telephone Encounter (Signed)
Patient has been advised of Pre-Admission date/time, and Surgery date at [facility].  Surgery Date: 06/27/23 Preadmission Testing Date: 06/22/23 (phone 1p-4p)  Patient has been made aware to call [phone], between 1-3:00pm the day before surgery, to find out what time to arrive for surgery.

## 2023-06-21 ENCOUNTER — Ambulatory Visit
Admission: RE | Admit: 2023-06-21 | Discharge: 2023-06-21 | Disposition: A | Discharge status: Home / Self Care | Payer: BC Managed Care – PPO | Source: Ambulatory Visit | Attending: General Surgery | Admitting: General Surgery

## 2023-06-21 ENCOUNTER — Ambulatory Visit: Payer: Self-pay | Admitting: General Surgery

## 2023-06-21 DIAGNOSIS — D170 Benign lipomatous neoplasm of skin and subcutaneous tissue of head, face and neck: Secondary | ICD-10-CM | POA: Diagnosis present

## 2023-06-21 DIAGNOSIS — R221 Localized swelling, mass and lump, neck: Secondary | ICD-10-CM

## 2023-06-22 ENCOUNTER — Inpatient Hospital Stay
Admission: RE | Admit: 2023-06-22 | Discharge: 2023-06-22 | Disposition: A | Discharge status: Home / Self Care | Payer: BC Managed Care – PPO | Source: Ambulatory Visit

## 2023-06-22 HISTORY — DX: Essential (primary) hypertension: I10

## 2023-06-22 HISTORY — DX: Obstructive sleep apnea (adult) (pediatric): G47.33

## 2023-06-22 HISTORY — DX: Obesity, unspecified: E66.9

## 2023-06-22 HISTORY — DX: Hypothyroidism, unspecified: E03.9

## 2023-06-22 NOTE — Pre-Procedure Instructions (Signed)
Pre-op interview call to patient. Patient stated that she may not be having surgery, is waiting to hear back from Dr. Maurine Minister office to confirm as neck mass may not be a lipoma. Prefers to wait until she confirms with Dr. Maurine Minister. Will reschedule the PAT call for Friday, February 21 to see if still on for surgery.

## 2023-06-24 ENCOUNTER — Telehealth (INDEPENDENT_AMBULATORY_CARE_PROVIDER_SITE_OTHER): Payer: BC Managed Care – PPO | Admitting: General Surgery

## 2023-06-24 ENCOUNTER — Inpatient Hospital Stay
Admission: RE | Admit: 2023-06-24 | Discharge: 2023-06-24 | Disposition: A | Discharge status: Home / Self Care | Payer: Self-pay | Source: Ambulatory Visit

## 2023-06-24 DIAGNOSIS — R221 Localized swelling, mass and lump, neck: Secondary | ICD-10-CM | POA: Diagnosis not present

## 2023-06-24 NOTE — Telephone Encounter (Signed)
Telephone Note  I called and identified the patient with 2 unique identifiers.  The patient was not in office and I was currently in our office.  I discussed with her that her ultrasound findings are consistent with cystic structure and no lipoma.  I independently reviewed the ultrasound results.  There are some septations in the cystic structure.  It does seem to be superficial and I do not see it adhered to any underlying vessels.  I discussed with her that this is not a lipoma as we thoughts and that I am not sure surgery would be appropriate at this time.  Moving forward I think we can aspirate this in the office and then send the effluent for analysis.  I did discuss with her that this may just be simple fluid from a seroma.  I also discussed that depending on what we find from aspiration we can potentially order a CT of her neck to better define anatomic proximity and possible etiology of this cystic structure.  She agrees to this and we will thus cancel her surgery for Monday.  She will call and schedule a date that works for her to come into the office to have the procedure done.  A total of 10 minutes was spent with patient on the phone discussing treatment options and a total of 10 minutes was spent reviewing imaging.

## 2023-06-24 NOTE — Patient Instructions (Addendum)
Your procedure is scheduled on: 06/27/23 - Monday Report to the Registration Desk on the 1st floor of the [facility]. To find out your arrival time, please call [phone] between 1PM - 3PM on: 06/24/23 - Friday If your arrival time is 6:00 am, do not arrive before that time as the Medical Mall entrance doors do not open until 6:00 am.  REMEMBER: Instructions that are not followed completely may result in serious medical risk, up to and including death; or upon the discretion of your surgeon and anesthesiologist your surgery may need to be rescheduled.  Do not eat food or drink any liquids after midnight the night before surgery.  No gum chewing or hard candies.   One week prior to surgery: Stop Anti-inflammatories (NSAIDS) such as Advil, Aleve, Ibuprofen, Motrin, Naproxen, Naprosyn and Aspirin based products such as Excedrin, Goody's Powder, BC Powder.You may take Tylenol if needed for pain up until the day of surgery.  Stop ANY OVER THE COUNTER supplements until after surgery.   ON THE DAY OF SURGERY ONLY TAKE THESE MEDICATIONS WITH SIPS OF WATER:  none   No Alcohol for 24 hours before or after surgery.  No Smoking including e-cigarettes for 24 hours before surgery.  No chewable tobacco products for at least 6 hours before surgery.  No nicotine patches on the day of surgery.  Do not use any "recreational" drugs for at least a week (preferably 2 weeks) before your surgery.  Please be advised that the combination of cocaine and anesthesia may have negative outcomes, up to and including death. If you test positive for cocaine, your surgery will be cancelled.  On the morning of surgery brush your teeth with toothpaste and water, you may rinse your mouth with mouthwash if you wish. Do not swallow any toothpaste or mouthwash.  Use CHG Soap or wipes as directed on instruction sheet.  Do not wear jewelry, make-up, hairpins, clips or nail polish.  For welded (permanent)  jewelry: bracelets, anklets, waist bands, etc.  Please have this removed prior to surgery.  If it is not removed, there is a chance that hospital personnel will need to cut it off on the day of surgery.  Do not wear lotions, powders, or perfumes.   Do not shave body hair from the neck down 48 hours before surgery.  Contact lenses, hearing aids and dentures may not be worn into surgery.  Do not bring valuables to the hospital. [facility] is not responsible for any missing/lost belongings or valuables.   Notify your doctor if there is any change in your medical condition (cold, fever, infection).  Wear comfortable clothing (specific to your surgery type) to the hospital.  After surgery, you can help prevent lung complications by doing breathing exercises.  Take deep breaths and cough every 1-2 hours. Your doctor may order a device called an Incentive Spirometer to help you take deep breaths. When coughing or sneezing, hold a pillow firmly against your incision with both hands. This is called "splinting." Doing this helps protect your incision. It also decreases belly discomfort.  If you are being admitted to the hospital overnight, leave your suitcase in the car. After surgery it may be brought to your room.  In case of increased patient census, it may be necessary for you, the patient, to continue your postoperative care in the Same Day Surgery department.  If you are being discharged the day of surgery, you will not be allowed to drive home. You will need a  responsible individual to drive you home and stay with you for 24 hours after surgery.   If you are taking public transportation, you will need to have a responsible individual with you.  Please call the Pre-admissions Testing Dept. at [phone] if you have any questions about these instructions.  Surgery Visitation Policy:  Patients having surgery or a procedure may have two visitors.  Children under the age of 70 must have  an adult with them who is not the patient.  Temporary Visitor Restrictions Due to increasing cases of flu, RSV and COVID-19: Children ages 62 and under will not be able to visit patients in [facility] hospitals under most circumstances.  Inpatient Visitation:    Visiting hours are 7 a.m. to 8 p.m. Up to four visitors are allowed at one time in a patient room. The visitors may rotate out with other people during the day.  One visitor age 20 or older may stay with the patient overnight and must be in the room by 8 p.m.

## 2023-06-27 ENCOUNTER — Encounter: Admission: RE | Payer: Self-pay | Source: Home / Self Care

## 2023-06-27 ENCOUNTER — Ambulatory Visit: Admission: RE | Admit: 2023-06-27 | Payer: BC Managed Care – PPO | Source: Home / Self Care | Admitting: General Surgery

## 2023-06-27 SURGERY — EXCISION LIPOMA
Anesthesia: General | Laterality: Left

## 2023-06-28 ENCOUNTER — Encounter: Payer: Self-pay | Admitting: General Surgery

## 2023-06-28 ENCOUNTER — Ambulatory Visit (INDEPENDENT_AMBULATORY_CARE_PROVIDER_SITE_OTHER): Payer: BC Managed Care – PPO | Admitting: General Surgery

## 2023-06-28 VITALS — BP 139/88 | HR 73 | Temp 98.5°F | Ht 64.0 in | Wt 209.0 lb

## 2023-06-28 DIAGNOSIS — T792XXA Traumatic secondary and recurrent hemorrhage and seroma, initial encounter: Secondary | ICD-10-CM | POA: Diagnosis not present

## 2023-06-28 DIAGNOSIS — R221 Localized swelling, mass and lump, neck: Secondary | ICD-10-CM

## 2023-06-28 NOTE — Patient Instructions (Signed)
 Seroma A seroma is a collection of fluid on the body that looks like swelling or a mass. Seromas form where tissue has been injured or cut. Seromas vary in size. Some are small and painless. Others may grow large and cause pain or discomfort. Many seromas go away on their own as the fluid is naturally absorbed by the body, and some seromas need to be drained. What are the causes? Seromas form because tissue has been damaged or removed. This tissue damage may happen during surgery or because of an injury or trauma. When tissue is damaged or removed, empty space is created. The body's defense system (immune system) causes fluid to enter the empty space and form a seroma. What are the signs or symptoms? Symptoms of this condition include: Swelling at the site of a surgical incision or an injury. Drainage of clear fluid at the surgery or injury site. Discomfort or pain. How is this diagnosed? This condition is diagnosed based on: Your symptoms. Your medical history. A physical exam. During the exam, your health care provider will press on the seroma. You may also have tests, such as: Blood tests. An ultrasound, a CT scan, or other imaging tests. How is this treated? Some seromas go away on their own. Your health care provider may watch the seroma to make sure it does not cause any problems. Seromas that do not go away on their own may be treated. Treatment may include: Using a needle to drain the fluid from the seroma (needle aspiration). Putting in a small, thin tube (catheter) to drain the fluid. Putting on a bandage (dressing), such as an elastic bandage or binder. Taking antibiotics, if the seroma gets infected. In rare cases, surgery may be done to remove the seroma and repair the area. Follow these instructions at home:  If you were prescribed antibiotics, take them as told by your health care provider. Do not stop using the antibiotic even if you start to feel better. Take  over-the-counter and prescription medicines only as told by your health care provider. Return to your normal activities as told by your health care provider. Ask your health care provider what activities are safe for you. Check your seroma every day for signs of infection. Check for: Redness or pain. More swelling. More fluid. Warmth. Pus or a bad smell. Keep all follow-up visits. Your health care provider needs to check that your seroma is healing. Contact a health care provider if: You have a fever. You have redness or pain at the site of your seroma. Your seroma is more swollen or is getting bigger. You have more fluid coming from your seroma. Your seroma feels warm to the touch. You have pus or a bad smell coming from your seroma. Get help right away if: You have a fever along with severe pain, redness at the site, or chills. You feel confused or have trouble staying awake. You feel like your heart is beating very fast. You feel short of breath or are breathing fast. You have cool, clammy, or sweaty skin. Summary Seromas can form because of injury or surgery on tissue. Seromas can cause swelling, drainage of clear fluid, and discomfort or pain at the surgery or injury site. Some seromas go away on their own. Other seromas may need to be drained. Check your seroma every day for signs of infection. Signs include redness, pain, more swelling, more fluid, warmth, pus, or a bad smell. This information is not intended to replace advice given to you  by your health care provider. Make sure you discuss any questions you have with your health care provider. Document Revised: 07/06/2021 Document Reviewed: 07/06/2021 Elsevier Patient Education  2024 [employer].

## 2023-06-29 NOTE — Progress Notes (Signed)
 Outpatient Surgical Follow Up CC: Left neck mass 06/29/2023  Julia Jenkins is an 53 y.o. female.   Chief Complaint  Patient presents with   Follow-up    Eval lipoma neck    HPI: The patient returns to clinic today to discuss her left neck mass.  She underwent an ultrasound that showed a septated cystic structure.  I called her on the phone last week and we discussed the options for her whether we should proceed with surgery or not.  We both agreed that surgical intervention was not the best option as of now.  She returns today to discuss the plan for this neck mass.  She reports that it continues to grow and it is grown significantly in the last several months.  She does not have any pain in the area but it does bother her when she tries to turn her head and she is concerned about the cosmetic appearance of this.  Past Medical History:  Diagnosis Date   Acquired hypothyroidism    Essential hypertension    Lipoma of neck    Mild obstructive sleep apnea    Obesity (BMI 30-39.9)     Past Surgical History:  Procedure Laterality Date   CESAREAN SECTION     LIPOMA EXCISION     Neck   TUBAL LIGATION     WISDOM TOOTH EXTRACTION      Family History  Problem Relation Age of Onset   Heart disease Maternal Grandmother    Hypertension Maternal Grandmother    Heart disease Maternal Grandfather    Hypertension Maternal Grandfather    Heart disease Paternal Grandmother    Hypertension Paternal Grandmother    Heart disease Paternal Grandfather    Heart attack Paternal Grandfather    Hypertension Paternal Grandfather    Hyperlipidemia Mother    Heart disease Father    Heart attack Father    Prostate cancer Father    Hypertension Father    Drug abuse Sister    Depression Sister    COPD Sister    Drug abuse Sister    Schizophrenia Sister    Personality disorder Sister     Social History:  reports that she has quit smoking. Her smoking use included cigarettes and e-cigarettes. She has  been exposed to tobacco smoke. She has never used smokeless tobacco. She reports that she does not currently use alcohol. She reports that she does not use drugs.  Allergies:  Allergies  Allergen Reactions   Cymbalta [Duloxetine Hcl] Other (See Comments)    Felt strange    Medications reviewed.    ROS Full ROS performed and is otherwise negative other than what is stated in HPI   BP 139/88   Pulse 73   Temp 98.5 F (36.9 C) (Oral)   Ht 5\' 4"  (1.626 m)   Wt 209 lb (94.8 kg)   SpO2 97%   BMI 35.87 kg/m   Physical Exam On exam her left neck there is obvious growth.  This is ballotable and soft without tenderness to palpation.   I independently reviewed her ultrasound.  This is consistent with a cystic structure that does have a septation in it.    No results found for this or any previous visit (from the past 48 hours). No results found.  Assessment/Plan:   I discussed options for this neck mass.  We can aspirate it and send the aspirate for analysis.  I discussed with her that I do not think we need to get  a CT of her neck now but if this persist we may need to pursue that to see if this is in connection with any other structures.  I discussed with her the risk, benefits and alternatives of aspiration of her neck cyst including risk of damage to underlying structures, bleeding and recurrence of the cystic structure.  She understands these risks and wishes to proceed please see procedure note below.  We will send the effluent for the labs below 1. Seroma due to trauma (HCC) (Primary) - Triglycerides - Body Fluid Culture - Cytology - non gyn - Albumin - Protein, total    After informed consent was obtained the patient was placed in the right lateral decubitus position.  The left neck was then prepped and draped in the usual sterile fashion.  Patient comfort was obtained with 1% lidocaine solution.  Using an ultrasound the cystic structure was identified and an 18-gauge  needle was inserted into the cystic structure.  I made several passes at aspiration was able to aspirate a total of 70 cc of fluid from her neck.  The aspirate was then sent for the labs above.  No bleeding was noted and the neck was then dressed with gauze and tape.  The patient tolerated the procedure well  Baker Pierini, M.D. [facility]

## 2023-06-30 LAB — PROTEIN, TOTAL

## 2023-06-30 LAB — ALBUMIN

## 2023-06-30 LAB — TRIGLYCERIDES

## 2023-07-04 LAB — CYTOLOGY - NON PAP

## 2023-07-06 LAB — SPECIMEN STATUS REPORT

## 2023-07-09 LAB — BODY FLUID CULTURE

## 2023-07-12 ENCOUNTER — Encounter: Payer: Self-pay | Admitting: General Surgery

## 2023-07-12 ENCOUNTER — Ambulatory Visit (INDEPENDENT_AMBULATORY_CARE_PROVIDER_SITE_OTHER): Payer: BC Managed Care – PPO | Admitting: General Surgery

## 2023-07-12 VITALS — BP 120/77 | HR 69 | Temp 98.2°F | Ht 64.0 in | Wt 210.0 lb

## 2023-07-12 DIAGNOSIS — T792XXD Traumatic secondary and recurrent hemorrhage and seroma, subsequent encounter: Secondary | ICD-10-CM | POA: Diagnosis not present

## 2023-07-12 DIAGNOSIS — Z09 Encounter for follow-up examination after completed treatment for conditions other than malignant neoplasm: Secondary | ICD-10-CM

## 2023-07-12 DIAGNOSIS — R221 Localized swelling, mass and lump, neck: Secondary | ICD-10-CM | POA: Diagnosis not present

## 2023-07-12 DIAGNOSIS — T792XXA Traumatic secondary and recurrent hemorrhage and seroma, initial encounter: Secondary | ICD-10-CM

## 2023-07-12 NOTE — Patient Instructions (Signed)
 Hematoma A hematoma is a collection of blood. A hematoma can happen: Under the skin. In an organ. In a body space. In a joint space. In other tissues. The blood can thicken (clot) to form a lump that you can see and feel. The lump is often hard and may become sore and tender. The lump can be very small or very big. Most hematomas get better in a few days to weeks. However, some of these may be serious and need medical care. What are the causes? This condition is caused by: An injury. Blood that leaks under the skin. Problems from surgeries. Medical conditions that cause bleeding or bruising. What increases the risk? You are more likely to develop this condition if: You are an older adult. You use medicines that thin your blood. You use NSAIDs, such as ibuprofen, often for pain. You play contact sports. What are the signs or symptoms? Symptoms depend on where the hematoma is in your body. If the hematoma is under the skin, there is: A firm lump on the body. Pain and tenderness in the area. Bruising. The skin above the lump may be blue, dark blue, purple-red, or yellowish. If the hematoma is deep in the tissues or body spaces, there may be: Blood in the stomach. This may cause pain in the belly (abdomen), weakness, passing out (fainting), and shortness of breath. Blood in the head. This may cause a headache, weakness, trouble speaking or understanding speech, or passing out. How is this treated? Treatment depends on the cause, size, and location of the hematoma. Treatment may include: Doing nothing. Many hematomas go away on their own without treatment. Surgery or close monitoring. This may be needed for large hematomas or hematomas that affect the body's organs. Medicines. These may be given if a medical condition caused the hematoma. Follow these instructions at home: Managing pain, stiffness, and swelling  If told, put ice on the injured area. To do this: Put ice in a plastic  bag. Place a towel between your skin and the bag. Leave the ice on for 20 minutes, 2-3 times a day for the first two days. If your skin turns bright red, take off the ice right away to prevent skin damage. The risk of skin damage is higher if you cannot feel pain, heat, or cold. If told, put heat on the injured area. Do this as often as told by your doctor. Use the heat source that your doctor recommends, such as a moist heat pack or a heating pad. Place a towel between your skin and the heat source. Leave the heat on for 20-30 minutes. If your skin turns bright red, take off the heat right away to prevent burns. The risk of burns is higher if you cannot feel pain, heat, or cold. Raise the injured area above the level of your heart while you are sitting or lying down. Wrap the affected area with an elastic bandage, if told by your doctor. Do not wrap the bandage too tight. If your hematoma is on a leg or foot and is painful, your doctor may give you crutches. Use them as told by your doctor. General instructions Take over-the-counter and prescription medicines only as told by your doctor. Keep all follow-up visits. Your doctor may want to see how your hematoma is healing with treatment. Contact a doctor if: You have a fever. The swelling or bruising gets worse. You start to get more hematomas. Your pain gets worse. Your pain is not getting better  with medicine. The skin over the hematoma breaks or starts to bleed. Get help right away if: Your hematoma is in your chest or belly and you: Pass out. Feel weak. Become short of breath. You have a hematoma on your scalp that is caused by a fall or injury, and you: Have a headache that gets worse. Have trouble speaking or understanding speech. Become less alert or you pass out. These symptoms may be an emergency. Get help right away. Call 911. Do not wait to see if the symptoms will go away. Do not drive yourself to the hospital This  information is not intended to replace advice given to you by your health care provider. Make sure you discuss any questions you have with your health care provider. Document Revised: 10/12/2021 Document Reviewed: 10/12/2021 Elsevier Patient Education  2024 [employer].

## 2023-07-12 NOTE — Progress Notes (Signed)
 Outpatient Surgical Follow Up  07/12/2023  Julia Jenkins is an 53 y.o. female.   Chief Complaint  Patient presents with   Follow-up    HPI: The patient returns status post aspiration of left neck seroma.  She reports doing well.  She denies any pain in the area.  She denies that there has been any increased swelling or recurrence of the seroma.  She denies any redness over her skin or further drainage.  She has had no fevers or chills.  Past Medical History:  Diagnosis Date   Acquired hypothyroidism    Essential hypertension    Lipoma of neck    Mild obstructive sleep apnea    Obesity (BMI 30-39.9)     Past Surgical History:  Procedure Laterality Date   CESAREAN SECTION     LIPOMA EXCISION     Neck   TUBAL LIGATION     WISDOM TOOTH EXTRACTION      Family History  Problem Relation Age of Onset   Heart disease Maternal Grandmother    Hypertension Maternal Grandmother    Heart disease Maternal Grandfather    Hypertension Maternal Grandfather    Heart disease Paternal Grandmother    Hypertension Paternal Grandmother    Heart disease Paternal Grandfather    Heart attack Paternal Grandfather    Hypertension Paternal Grandfather    Hyperlipidemia Mother    Heart disease Father    Heart attack Father    Prostate cancer Father    Hypertension Father    Drug abuse Sister    Depression Sister    COPD Sister    Drug abuse Sister    Schizophrenia Sister    Personality disorder Sister     Social History:  reports that she has quit smoking. Her smoking use included cigarettes and e-cigarettes. She has been exposed to tobacco smoke. She has never used smokeless tobacco. She reports that she does not currently use alcohol. She reports that she does not use drugs.  Allergies:  Allergies  Allergen Reactions   Cymbalta [Duloxetine Hcl] Other (See Comments)    Felt strange    Medications reviewed.    ROS Full ROS performed and is otherwise negative other than what is  stated in HPI   BP 120/77   Pulse 69   Temp 98.2 F (36.8 C)   Ht 5\' 4"  (1.626 m)   Wt 210 lb (95.3 kg)   SpO2 97%   BMI 36.05 kg/m   Physical Exam Left neck without any swelling.  No evidence of recurrence of seroma.  No bleeding or drainage from the areas of aspiration.    I reviewed the labs from the aspiration.  Most of these are unable to be obtained as the fluid was just serous fluid.  There were no cells noted on cytology.  Assessment/Plan:  Patient with seroma status post lipoma excision.  I aspirated this several weeks ago.  There has been no evidence of recurrence.  I discussed with the patient that my hope is that since his aspirated it will not recur.  However, if it does recur then we can attempt another aspiration.  No concerning findings from the labs of the aspirate.  She will call us if there is a recurrence.  Otherwise she can follow-up as needed   Baker Pierini, M.D. [facility]

## 2023-08-25 ENCOUNTER — Encounter: Payer: Self-pay | Admitting: General Surgery

## 2023-08-25 ENCOUNTER — Ambulatory Visit: Payer: Self-pay | Admitting: General Surgery

## 2023-08-25 ENCOUNTER — Ambulatory Visit (INDEPENDENT_AMBULATORY_CARE_PROVIDER_SITE_OTHER): Admitting: General Surgery

## 2023-08-25 VITALS — BP 137/80 | HR 68 | Temp 98.3°F | Ht 64.0 in | Wt 204.0 lb

## 2023-08-25 DIAGNOSIS — T792XXA Traumatic secondary and recurrent hemorrhage and seroma, initial encounter: Secondary | ICD-10-CM

## 2023-08-25 DIAGNOSIS — R221 Localized swelling, mass and lump, neck: Secondary | ICD-10-CM | POA: Diagnosis not present

## 2023-08-25 DIAGNOSIS — T792XXD Traumatic secondary and recurrent hemorrhage and seroma, subsequent encounter: Secondary | ICD-10-CM

## 2023-08-25 NOTE — Progress Notes (Signed)
 Outpatient Surgical Follow Up  08/25/2023  Julia Jenkins is an 53 y.o. female.   Chief Complaint  Patient presents with   Follow-up    Neck hematoma    HPI: The patient returns today as she has been seeing me for a left neck seroma.  She had a lipoma removed twice from her left neck several years ago.  She Julia Jenkins presented a couple of months ago thinking that this was a recurrence of the lipoma.  I got an ultrasound that showed fluid.  In her last visit we aspirated it and it was completely serous fluid.  She says that it was fine for about 2 to 3 weeks and then it started to reaccumulate.  She reports now that it is about the same size as what it was prior to aspiration.  She denies any overlying skin changes or drainage.  She denies any pain of it but it does cause her distress as it is cosmetically unpleasing.  Past Medical History:  Diagnosis Date   Acquired hypothyroidism    Essential hypertension    Lipoma of neck    Mild obstructive sleep apnea    Obesity (BMI 30-39.9)     Past Surgical History:  Procedure Laterality Date   CESAREAN SECTION     LIPOMA EXCISION     Neck   TUBAL LIGATION     WISDOM TOOTH EXTRACTION      Family History  Problem Relation Age of Onset   Heart disease Maternal Grandmother    Hypertension Maternal Grandmother    Heart disease Maternal Grandfather    Hypertension Maternal Grandfather    Heart disease Paternal Grandmother    Hypertension Paternal Grandmother    Heart disease Paternal Grandfather    Heart attack Paternal Grandfather    Hypertension Paternal Grandfather    Hyperlipidemia Mother    Heart disease Father    Heart attack Father    Prostate cancer Father    Hypertension Father    Drug abuse Sister    Depression Sister    COPD Sister    Drug abuse Sister    Schizophrenia Sister    Personality disorder Sister     Social History:  reports that she has quit smoking. Her smoking use included cigarettes and e-cigarettes. She has  been exposed to tobacco smoke. She has never used smokeless tobacco. She reports that she does not currently use alcohol. She reports that she does not use drugs.  Allergies:  Allergies  Allergen Reactions   Cymbalta  [Duloxetine  Hcl] Other (See Comments)    Felt strange    Medications reviewed.    ROS Full ROS performed and is otherwise negative other than what is stated in HPI   BP 137/80   Pulse 68   Temp 98.3 F (36.8 C) (Oral)   Ht 5\' 4"  (1.626 m)   Wt 204 lb (92.5 kg)   SpO2 97%   BMI 35.02 kg/m   Physical Exam Alert and oriented x 3, clear to auscultation bilaterally, regular rate and rhythm, moving all extremities spontaneously, mood and affect appropriate, left neck with soft ballotable soft tissue mass consistent with a seroma.    Ultrasound consistent with seroma Assessment/Plan:  I discussed with the patient that there are 2 options that we can do.  We could ask IR to place a drain in it and see if it collapses and if it does not we could place a sclerosing agent into the seroma cavity to try to get this  to resorb.  Our other option is to perform a surgical debridement of the seroma cavity.  I discussed with her that if we did that that I would certainly place a drain and we would see if it reaccumulate's.  If it did reaccumulate if we had a drain in there we would add a sclerosing agent.  She is quite frustrated with this and would like to proceed with surgery.  I discussed the risk, benefits alternatives of the procedure including risk of infection, bleeding, recurrence of the seroma and damage to underlying structures.  She understands these risks and wishes to proceed with surgery.   Severa Daniels, M.D. [facility]

## 2023-08-25 NOTE — Patient Instructions (Signed)
 Hematoma A hematoma is a collection of blood. A hematoma can happen: Under the skin. In an organ. In a body space. In a joint space. In other tissues. The blood can thicken (clot) to form a lump that you can see and feel. The lump is often hard and may become sore and tender. The lump can be very small or very big. Most hematomas get better in a few days to weeks. However, some of these may be serious and need medical care. What are the causes? This condition is caused by: An injury. Blood that leaks under the skin. Problems from surgeries. Medical conditions that cause bleeding or bruising. What increases the risk? You are more likely to develop this condition if: You are an older adult. You use medicines that thin your blood. You use NSAIDs, such as ibuprofen, often for pain. You play contact sports. What are the signs or symptoms? Symptoms depend on where the hematoma is in your body. If the hematoma is under the skin, there is: A firm lump on the body. Pain and tenderness in the area. Bruising. The skin above the lump may be blue, dark blue, purple-red, or yellowish. If the hematoma is deep in the tissues or body spaces, there may be: Blood in the stomach. This may cause pain in the belly (abdomen), weakness, passing out (fainting), and shortness of breath. Blood in the head. This may cause a headache, weakness, trouble speaking or understanding speech, or passing out. How is this treated? Treatment depends on the cause, size, and location of the hematoma. Treatment may include: Doing nothing. Many hematomas go away on their own without treatment. Surgery or close monitoring. This may be needed for large hematomas or hematomas that affect the body's organs. Medicines. These may be given if a medical condition caused the hematoma. Follow these instructions at home: Managing pain, stiffness, and swelling  If told, put ice on the injured area. To do this: Put ice in a plastic  bag. Place a towel between your skin and the bag. Leave the ice on for 20 minutes, 2-3 times a day for the first two days. If your skin turns bright red, take off the ice right away to prevent skin damage. The risk of skin damage is higher if you cannot feel pain, heat, or cold. If told, put heat on the injured area. Do this as often as told by your doctor. Use the heat source that your doctor recommends, such as a moist heat pack or a heating pad. Place a towel between your skin and the heat source. Leave the heat on for 20-30 minutes. If your skin turns bright red, take off the heat right away to prevent burns. The risk of burns is higher if you cannot feel pain, heat, or cold. Raise the injured area above the level of your heart while you are sitting or lying down. Wrap the affected area with an elastic bandage, if told by your doctor. Do not wrap the bandage too tight. If your hematoma is on a leg or foot and is painful, your doctor may give you crutches. Use them as told by your doctor. General instructions Take over-the-counter and prescription medicines only as told by your doctor. Keep all follow-up visits. Your doctor may want to see how your hematoma is healing with treatment. Contact a doctor if: You have a fever. The swelling or bruising gets worse. You start to get more hematomas. Your pain gets worse. Your pain is not getting better  with medicine. The skin over the hematoma breaks or starts to bleed. Get help right away if: Your hematoma is in your chest or belly and you: Pass out. Feel weak. Become short of breath. You have a hematoma on your scalp that is caused by a fall or injury, and you: Have a headache that gets worse. Have trouble speaking or understanding speech. Become less alert or you pass out. These symptoms may be an emergency. Get help right away. Call 911. Do not wait to see if the symptoms will go away. Do not drive yourself to the hospital This  information is not intended to replace advice given to you by your health care provider. Make sure you discuss any questions you have with your health care provider. Document Revised: 10/12/2021 Document Reviewed: 10/12/2021 Elsevier Patient Education  2024 [employer].

## 2023-08-26 ENCOUNTER — Telehealth: Payer: Self-pay | Admitting: General Surgery

## 2023-08-26 NOTE — Telephone Encounter (Signed)
 Patient has been advised of Pre-Admission date/time, and Surgery date at [facility].  Surgery Date: 08/29/23 Preadmission Testing Date: 08/29/23 (arrive 2 hours early)  Patient informed of the scheduling process and surgery information given at time of office visit.  Patient has been made aware to call [phone], between 1-3:00pm the day before surgery, to find out what time to arrive for surgery.

## 2023-08-28 MED ORDER — CHLORHEXIDINE GLUCONATE CLOTH 2 % EX PADS
6.0000 | MEDICATED_PAD | Freq: Once | CUTANEOUS | Status: AC
  Administered 2023-08-29: 6 via TOPICAL

## 2023-08-28 MED ORDER — CEFAZOLIN SODIUM-DEXTROSE 2-4 GM/100ML-% IV SOLN
2.0000 g | INTRAVENOUS | Status: AC
  Administered 2023-08-29: 2 g via INTRAVENOUS

## 2023-08-28 MED ORDER — CHLORHEXIDINE GLUCONATE CLOTH 2 % EX PADS: 6.0000 | MEDICATED_PAD | Freq: Once | CUTANEOUS | Status: DC

## 2023-08-29 ENCOUNTER — Ambulatory Visit
Admission: RE | Admit: 2023-08-29 | Discharge: 2023-08-29 | Disposition: A | Discharge status: Home / Self Care | Attending: General Surgery | Admitting: General Surgery

## 2023-08-29 ENCOUNTER — Other Ambulatory Visit: Payer: Self-pay

## 2023-08-29 ENCOUNTER — Ambulatory Visit: Admitting: Anesthesiology

## 2023-08-29 ENCOUNTER — Encounter: Payer: Self-pay | Admitting: General Surgery

## 2023-08-29 ENCOUNTER — Encounter
Admission: RE | Disposition: A | Discharge status: Home / Self Care | Payer: Self-pay | Source: Home / Self Care | Attending: General Surgery

## 2023-08-29 DIAGNOSIS — T792XXA Traumatic secondary and recurrent hemorrhage and seroma, initial encounter: Secondary | ICD-10-CM

## 2023-08-29 DIAGNOSIS — E669 Obesity, unspecified: Secondary | ICD-10-CM | POA: Diagnosis not present

## 2023-08-29 DIAGNOSIS — T792XXD Traumatic secondary and recurrent hemorrhage and seroma, subsequent encounter: Secondary | ICD-10-CM | POA: Diagnosis not present

## 2023-08-29 DIAGNOSIS — G4733 Obstructive sleep apnea (adult) (pediatric): Secondary | ICD-10-CM | POA: Diagnosis not present

## 2023-08-29 DIAGNOSIS — L7633 Postprocedural seroma of skin and subcutaneous tissue following a dermatologic procedure: Secondary | ICD-10-CM | POA: Diagnosis not present

## 2023-08-29 DIAGNOSIS — Z6835 Body mass index (BMI) 35.0-35.9, adult: Secondary | ICD-10-CM | POA: Insufficient documentation

## 2023-08-29 DIAGNOSIS — Z87891 Personal history of nicotine dependence: Secondary | ICD-10-CM | POA: Diagnosis not present

## 2023-08-29 DIAGNOSIS — Z01812 Encounter for preprocedural laboratory examination: Secondary | ICD-10-CM

## 2023-08-29 DIAGNOSIS — I10 Essential (primary) hypertension: Secondary | ICD-10-CM | POA: Insufficient documentation

## 2023-08-29 DIAGNOSIS — R221 Localized swelling, mass and lump, neck: Secondary | ICD-10-CM | POA: Diagnosis not present

## 2023-08-29 DIAGNOSIS — L7634 Postprocedural seroma of skin and subcutaneous tissue following other procedure: Secondary | ICD-10-CM | POA: Diagnosis present

## 2023-08-29 HISTORY — PX: EXCISION MASS NECK: SHX6703

## 2023-08-29 SURGERY — EXCISION, MASS, NECK
Anesthesia: General | Site: Neck | Laterality: Left

## 2023-08-29 MED ORDER — CHLORHEXIDINE GLUCONATE 0.12 % MT SOLN
OROMUCOSAL | Status: AC
  Filled 2023-08-29: qty 15

## 2023-08-29 MED ORDER — ACETAMINOPHEN 10 MG/ML IV SOLN: 1000.0000 mg | Freq: Once | INTRAVENOUS | Status: DC | PRN

## 2023-08-29 MED ORDER — OXYCODONE HCL 5 MG/5ML PO SOLN: 5.0000 mg | Freq: Once | ORAL | Status: AC | PRN

## 2023-08-29 MED ORDER — EPHEDRINE SULFATE-NACL 50-0.9 MG/10ML-% IV SOSY
PREFILLED_SYRINGE | INTRAVENOUS | Status: DC | PRN
  Administered 2023-08-29: 10 mg via INTRAVENOUS

## 2023-08-29 MED ORDER — FENTANYL CITRATE (PF) 100 MCG/2ML IJ SOLN
25.0000 ug | INTRAMUSCULAR | Status: DC | PRN
  Administered 2023-08-29 (×2): 25 ug via INTRAVENOUS

## 2023-08-29 MED ORDER — DEXAMETHASONE SODIUM PHOSPHATE 10 MG/ML IJ SOLN
INTRAMUSCULAR | Status: AC
  Filled 2023-08-29: qty 1

## 2023-08-29 MED ORDER — MIDAZOLAM HCL 2 MG/2ML IJ SOLN
INTRAMUSCULAR | Status: AC
  Filled 2023-08-29: qty 2

## 2023-08-29 MED ORDER — OXYCODONE HCL 5 MG PO TABS
5.0000 mg | ORAL_TABLET | Freq: Once | ORAL | Status: AC | PRN
  Administered 2023-08-29: 5 mg via ORAL

## 2023-08-29 MED ORDER — LIDOCAINE HCL (CARDIAC) PF 100 MG/5ML IV SOSY
PREFILLED_SYRINGE | INTRAVENOUS | Status: DC | PRN
  Administered 2023-08-29: 100 mg via INTRAVENOUS

## 2023-08-29 MED ORDER — BUPIVACAINE LIPOSOME 1.3 % IJ SUSP
INTRAMUSCULAR | Status: AC
  Filled 2023-08-29: qty 10

## 2023-08-29 MED ORDER — OXYCODONE HCL 5 MG PO TABS
ORAL_TABLET | ORAL | Status: AC
  Filled 2023-08-29: qty 1

## 2023-08-29 MED ORDER — DEXAMETHASONE SODIUM PHOSPHATE 10 MG/ML IJ SOLN: INTRAMUSCULAR | Status: DC | PRN

## 2023-08-29 MED ORDER — PROPOFOL 10 MG/ML IV BOLUS
INTRAVENOUS | Status: AC
Start: 2023-08-29 — End: ?
  Filled 2023-08-29: qty 20

## 2023-08-29 MED ORDER — LACTATED RINGERS IV SOLN: INTRAVENOUS | Status: DC

## 2023-08-29 MED ORDER — OXYCODONE HCL 5 MG PO TABS: 5.0000 mg | ORAL_TABLET | Freq: Four times a day (QID) | ORAL | 0 refills | Status: DC | PRN

## 2023-08-29 MED ORDER — ONDANSETRON HCL 4 MG/2ML IJ SOLN
INTRAMUSCULAR | Status: AC
  Filled 2023-08-29: qty 2

## 2023-08-29 MED ORDER — DROPERIDOL 2.5 MG/ML IJ SOLN: 0.6250 mg | Freq: Once | INTRAMUSCULAR | Status: DC | PRN

## 2023-08-29 MED ORDER — FENTANYL CITRATE (PF) 100 MCG/2ML IJ SOLN
INTRAMUSCULAR | Status: DC | PRN
  Administered 2023-08-29 (×2): 50 ug via INTRAVENOUS

## 2023-08-29 MED ORDER — MIDAZOLAM HCL 2 MG/2ML IJ SOLN
INTRAMUSCULAR | Status: DC | PRN
  Administered 2023-08-29: 2 mg via INTRAVENOUS

## 2023-08-29 MED ORDER — KETOROLAC TROMETHAMINE 30 MG/ML IJ SOLN
INTRAMUSCULAR | Status: AC
  Filled 2023-08-29: qty 1

## 2023-08-29 MED ORDER — LIDOCAINE HCL (PF) 2 % IJ SOLN
INTRAMUSCULAR | Status: AC
  Filled 2023-08-29: qty 5

## 2023-08-29 MED ORDER — FENTANYL CITRATE (PF) 100 MCG/2ML IJ SOLN
INTRAMUSCULAR | Status: AC
  Filled 2023-08-29: qty 2

## 2023-08-29 MED ORDER — BUPIVACAINE-EPINEPHRINE (PF) 0.5% -1:200000 IJ SOLN
INTRAMUSCULAR | Status: DC | PRN
  Administered 2023-08-29: 20 mL via INTRAMUSCULAR

## 2023-08-29 MED ORDER — ORAL CARE MOUTH RINSE: 15.0000 mL | Freq: Once | OROMUCOSAL | Status: AC

## 2023-08-29 MED ORDER — PROPOFOL 10 MG/ML IV BOLUS
INTRAVENOUS | Status: DC | PRN
  Administered 2023-08-29: 180 mg via INTRAVENOUS

## 2023-08-29 MED ORDER — CEFAZOLIN SODIUM-DEXTROSE 2-4 GM/100ML-% IV SOLN
INTRAVENOUS | Status: AC
  Filled 2023-08-29: qty 100

## 2023-08-29 MED ORDER — BUPIVACAINE-EPINEPHRINE (PF) 0.5% -1:200000 IJ SOLN
INTRAMUSCULAR | Status: AC
  Filled 2023-08-29: qty 10

## 2023-08-29 MED ORDER — ONDANSETRON HCL 4 MG/2ML IJ SOLN
INTRAMUSCULAR | Status: DC | PRN
  Administered 2023-08-29: 4 mg via INTRAVENOUS

## 2023-08-29 MED ORDER — DEXAMETHASONE SODIUM PHOSPHATE 10 MG/ML IJ SOLN
INTRAMUSCULAR | Status: DC | PRN
  Administered 2023-08-29: 4 mg via INTRAVENOUS

## 2023-08-29 MED ORDER — CHLORHEXIDINE GLUCONATE 0.12 % MT SOLN
15.0000 mL | Freq: Once | OROMUCOSAL | Status: AC
  Administered 2023-08-29: 15 mL via OROMUCOSAL

## 2023-08-29 MED ORDER — DEXMEDETOMIDINE HCL IN NACL 80 MCG/20ML IV SOLN
INTRAVENOUS | Status: DC | PRN
  Administered 2023-08-29: 8 ug via INTRAVENOUS
  Administered 2023-08-29: 4 ug via INTRAVENOUS

## 2023-08-29 SURGICAL SUPPLY — 27 items
BLADE SURG 15 STRL LF DISP TIS (BLADE) ×1 IMPLANT
CHLORAPREP W/TINT 26 (MISCELLANEOUS) IMPLANT
DERMABOND ADVANCED .7 DNX12 (GAUZE/BANDAGES/DRESSINGS) ×1 IMPLANT
DRAIN 1/8 RD END PERF LFSIL ST (DRAIN) IMPLANT
DRAPE LAPAROTOMY 100X77 ABD (DRAPES) ×1 IMPLANT
ELECTRODE REM PT RTRN 9FT ADLT (ELECTROSURGICAL) ×1 IMPLANT
EVACUATOR SILICONE 100CC (DRAIN) IMPLANT
GLOVE BIOGEL PI IND STRL 7.5 (GLOVE) ×1 IMPLANT
GLOVE SURG SYN 7.0 (GLOVE) ×4 IMPLANT
GLOVE SURG SYN 7.0 PF PI (GLOVE) ×1 IMPLANT
GOWN STRL REUS W/ TWL LRG LVL3 (GOWN DISPOSABLE) ×2 IMPLANT
KIT TURNOVER KIT A (KITS) ×1 IMPLANT
LABEL OR SOLS (LABEL) ×1 IMPLANT
MANIFOLD NEPTUNE II (INSTRUMENTS) ×1 IMPLANT
NDL HYPO 22X1.5 SAFETY MO (MISCELLANEOUS) ×1 IMPLANT
NEEDLE HYPO 22X1.5 SAFETY MO (MISCELLANEOUS) ×1 IMPLANT
NS IRRIG 500ML POUR BTL (IV SOLUTION) ×1 IMPLANT
PACK BASIN MINOR ARMC (MISCELLANEOUS) ×1 IMPLANT
SPONGE DRAIN TRACH 4X4 STRL 2S (GAUZE/BANDAGES/DRESSINGS) IMPLANT
SPONGE T-LAP 18X18 ~~LOC~~+RFID (SPONGE) IMPLANT
SUT VIC AB 3-0 SH 27X BRD (SUTURE) ×1 IMPLANT
SUTURE EHLN 3-0 FS-10 30 BLK (SUTURE) IMPLANT
SUTURE MNCRL 4-0 27XMF (SUTURE) ×1 IMPLANT
SYR 10ML LL (SYRINGE) ×1 IMPLANT
SYR 20ML LL LF (SYRINGE) ×1 IMPLANT
TRAP FLUID SMOKE EVACUATOR (MISCELLANEOUS) ×1 IMPLANT
WATER STERILE IRR 500ML POUR (IV SOLUTION) ×1 IMPLANT

## 2023-08-29 NOTE — Anesthesia Procedure Notes (Signed)
 Procedure Name: LMA Insertion Date/Time: 08/29/2023 9:38 AM  Performed by: [name], CRNAPre-anesthesia Checklist: Patient identified, Patient being monitored, Timeout performed, Emergency Drugs available and Suction available Patient Re-evaluated:Patient Re-evaluated prior to induction Oxygen Delivery Method: Circle system utilized Preoxygenation: Pre-oxygenation with 100% oxygen Induction Type: IV induction Ventilation: Mask ventilation without difficulty LMA: LMA inserted LMA Size: 4.0 Tube type: Oral Number of attempts: 1 Placement Confirmation: positive ETCO2 and breath sounds checked- equal and bilateral Tube secured with: Tape Dental Injury: Teeth and Oropharynx as per pre-operative assessment  Comments: LMA x1 attempt. Atraumatic. Dentition unchanged from preop baseline.

## 2023-08-29 NOTE — Anesthesia Postprocedure Evaluation (Signed)
 Anesthesia Post Note  Patient: Julia Jenkins  Procedure(s) Performed: EXCISION, MASS, NECK (Left: Neck)  Patient location during evaluation: PACU Anesthesia Type: General Level of consciousness: awake and alert Pain management: pain level controlled Vital Signs Assessment: post-procedure vital signs reviewed and stable Respiratory status: spontaneous breathing, nonlabored ventilation, respiratory function stable and patient connected to nasal cannula oxygen Cardiovascular status: blood pressure returned to baseline and stable Postop Assessment: no apparent nausea or vomiting Anesthetic complications: no  No notable events documented.   Last Vitals:  Vitals:   08/29/23 1200 08/29/23 1221  BP: (!) 158/87 (!) 159/88  Pulse: 66 70  Resp: 13 14  Temp: (!) 36.4 C (!) 36.4 C  SpO2: 100% 99%    Last Pain:  Vitals:   08/29/23 1200  TempSrc:   PainSc: 0-No pain                 Enrique Harvest

## 2023-08-29 NOTE — Anesthesia Preprocedure Evaluation (Addendum)
 Anesthesia Evaluation  Patient identified by MRN, date of birth, ID band Patient awake    Reviewed: Allergy & Precautions, H&P , NPO status , Patient's Chart, lab work & pertinent test results  Airway Mallampati: II  TM Distance: >3 FB Neck ROM: full    Dental no notable dental hx.    Pulmonary sleep apnea , former smoker   Pulmonary exam normal        Cardiovascular Exercise Tolerance: Good hypertension, Normal cardiovascular exam     Neuro/Psych negative neurological ROS  negative psych ROS   GI/Hepatic negative GI ROS, Neg liver ROS,,,  Endo/Other  negative endocrine ROS    Renal/GU      Musculoskeletal   Abdominal  (+) + obese  Peds  Hematology negative hematology ROS (+)   Anesthesia Other Findings Past Medical History: No date: Acquired hypothyroidism No date: Essential hypertension No date: Lipoma of neck No date: Mild obstructive sleep apnea No date: Obesity (BMI 30-39.9)  Past Surgical History: No date: CESAREAN SECTION No date: LIPOMA EXCISION     Comment:  Neck No date: TUBAL LIGATION No date: WISDOM TOOTH EXTRACTION     Reproductive/Obstetrics negative OB ROS                             Anesthesia Physical Anesthesia Plan  ASA: 2  Anesthesia Plan: General   Post-op Pain Management: Regional block*, Ofirmev IV (intra-op)* and Toradol IV (intra-op)*   Induction: Intravenous  PONV Risk Score and Plan: Dexamethasone, Ondansetron, Midazolam and Treatment may vary due to age or medical condition  Airway Management Planned: LMA and Oral ETT  Additional Equipment:   Intra-op Plan:   Post-operative Plan: Extubation in OR  Informed Consent: I have reviewed the patients History and Physical, chart, labs and discussed the procedure including the risks, benefits and alternatives for the proposed anesthesia with the patient or authorized representative who has  indicated his/her understanding and acceptance.     Dental Advisory Given  Plan Discussed with: Anesthesiologist, CRNA and Surgeon  Anesthesia Plan Comments:         Anesthesia Quick Evaluation

## 2023-08-29 NOTE — Op Note (Addendum)
 Operative note  Preoperative diagnosis: Left neck fluid collection Postoperative diagnosis left neck seroma Surgeon: Severa Daniels, MD Procedure: Left neck exploration and excision of seroma, excision of neck mass measuring 3.5cm EBL: 10ccs  After informed consent was obtained the patient was brought to the operating room placed supine on the operating room table.  General endotracheal anesthesia was then induced and her left neck was then prepped and draped in the usual sterile fashion.  A surgical timeout was called identifying correct patient, site, side and procedure.  Using ultrasound I identified an area of fluid collection that was well-defined in the left neck.  This corresponded to an area where I had previously aspirated and returned serous fluid.  An incision was made at the site of a previous scar over this fluid collection.  On the ultrasound did note that there was a area of soft tissue that was above the mass.  The subcutaneous tissue was dissected through.  I then encountered the sternocleidomastoid muscle.  I again looked at the ultrasound and it appeared that the mass was deep to the SCM.  To ensure that this was still serous fluid a 22-gauge needle was inserted into the mass and I aspirated serous fluid.  The muscle was then split revealing the seroma cavity.  The seroma cavity was fully dissected out from the surrounding tissue.  This was overlying the internal jugular vein.  It was dissected off of the IJ and excised fully.  The seroma wall was then passed off the table as specimen. It measured approximately 3.5cm I ensured that there was complete hemostasis.  The cavity was then irrigated with 10 cc of warm saline solution.  A 10 French round Blake drain was then placed into the area of the previous cavity.  This was taken out through the skin of the left neck and secured to the skin with a 3-0 nylon suture.  The fascia of the SCM was then reapproximated with 3-0 Vicryl and the deep  dermal layer was also really approximated with 3-0 Vicryl suture.  The skin was then closed with 4-0 Monocryl and dressed with glue.  Prior to termination of the procedure all sponge and instrument counts were correct x 2.  The patient was awoken taken to the PACU in good condition.

## 2023-08-29 NOTE — Transfer of Care (Signed)
 Immediate Anesthesia Transfer of Care Note  Patient: Julia Jenkins  Procedure(s) Performed: EXCISION, MASS, NECK (Left: Neck)  Patient Location: PACU  Anesthesia Type:General  Level of Consciousness: awake, alert , oriented, and patient cooperative  Airway & Oxygen Therapy: Patient Spontanous Breathing and Patient connected to face mask oxygen  Post-op Assessment: Report given to RN and Post -op Vital signs reviewed and stable  Post vital signs: Reviewed and stable  Last Vitals:  Vitals Value Taken Time  BP 149/84 08/29/23 1115  Temp    Pulse 72 08/29/23 1119  Resp 23 08/29/23 1119  SpO2 100 % 08/29/23 1119  Vitals shown include unfiled device data.  Last Pain:  Vitals:   08/29/23 0914  TempSrc: Temporal  PainSc: 0-No pain       Pt transported to PACU with circ RN and report given to PACU RN. Patent airway to PACU, breathing spontaneously on 6L O2 via FM. Pt awake, responsive and comfortable. VSS.   Complications: No notable events documented.

## 2023-08-29 NOTE — H&P (Signed)
 No changes to below H and P, proceed as planned.   Expand All Collapse All  Outpatient Surgical Follow Up   08/25/2023   Julia Jenkins is an 53 y.o. female.        Chief Complaint  Patient presents with   Follow-up      Neck hematoma      HPI: The patient returns today as she has been seeing me for a left neck seroma.  She had a lipoma removed twice from her left neck several years ago.  She Julia Jenkins presented a couple of months ago thinking that this was a recurrence of the lipoma.  I got an ultrasound that showed fluid.  In her last visit we aspirated it and it was completely serous fluid.  She says that it was fine for about 2 to 3 weeks and then it started to reaccumulate.  She reports now that it is about the same size as what it was prior to aspiration.  She denies any overlying skin changes or drainage.  She denies any pain of it but it does cause her distress as it is cosmetically unpleasing.       Past Medical History:  Diagnosis Date   Acquired hypothyroidism     Essential hypertension     Lipoma of neck     Mild obstructive sleep apnea     Obesity (BMI 30-39.9)                 Past Surgical History:  Procedure Laterality Date   CESAREAN SECTION       LIPOMA EXCISION        Neck   TUBAL LIGATION       WISDOM TOOTH EXTRACTION                   Family History  Problem Relation Age of Onset   Heart disease Maternal Grandmother     Hypertension Maternal Grandmother     Heart disease Maternal Grandfather     Hypertension Maternal Grandfather     Heart disease Paternal Grandmother     Hypertension Paternal Grandmother     Heart disease Paternal Grandfather     Heart attack Paternal Grandfather     Hypertension Paternal Grandfather     Hyperlipidemia Mother     Heart disease Father     Heart attack Father     Prostate cancer Father     Hypertension Father     Drug abuse Sister     Depression Sister     COPD Sister     Drug abuse Sister     Schizophrenia  Sister     Personality disorder Sister            Social History:  reports that she has quit smoking. Her smoking use included cigarettes and e-cigarettes. She has been exposed to tobacco smoke. She has never used smokeless tobacco. She reports that she does not currently use alcohol. She reports that she does not use drugs.   Allergies:  Allergies       Allergies  Allergen Reactions   Cymbalta  [Duloxetine  Hcl] Other (See Comments)      Felt strange        Medications reviewed.       ROS Full ROS performed and is otherwise negative other than what is stated in HPI     BP 137/80   Pulse 68   Temp 98.3 F (36.8 C) (Oral)   Ht 5\' 4"  (  1.626 m)   Wt 204 lb (92.5 kg)   SpO2 97%   BMI 35.02 kg/m    Physical Exam Alert and oriented x 3, clear to auscultation bilaterally, regular rate and rhythm, moving all extremities spontaneously, mood and affect appropriate, left neck with soft ballotable soft tissue mass consistent with a seroma.       Ultrasound consistent with seroma Assessment/Plan:   I discussed with the patient that there are 2 options that we can do.  We could ask IR to place a drain in it and see if it collapses and if it does not we could place a sclerosing agent into the seroma cavity to try to get this to resorb.  Our other option is to perform a surgical debridement of the seroma cavity.  I discussed with her that if we did that that I would certainly place a drain and we would see if it reaccumulate's.  If it did reaccumulate if we had a drain in there we would add a sclerosing agent.  She is quite frustrated with this and would like to proceed with surgery.  I discussed the risk, benefits alternatives of the procedure including risk of infection, bleeding, recurrence of the seroma and damage to underlying structures.  She understands these risks and wishes to proceed with surgery.     Severa Daniels, M.D. [facility]

## 2023-08-29 NOTE — Progress Notes (Signed)
 Per Dr. Cornel Diesel patient is good to go home, no edema noted a neck, 10 cc serosanguinous drainage emptied and patient and friend educated about drain care. Patient has slight localized redness at left hand, Dr. Cornel Diesel noted and patient had BP cuff on that side. No new orders or interventions at this time.

## 2023-08-30 ENCOUNTER — Encounter: Payer: Self-pay | Admitting: General Surgery

## 2023-08-30 LAB — SURGICAL PATHOLOGY

## 2023-09-06 ENCOUNTER — Encounter: Payer: Self-pay | Admitting: General Surgery

## 2023-09-06 ENCOUNTER — Ambulatory Visit (INDEPENDENT_AMBULATORY_CARE_PROVIDER_SITE_OTHER): Admitting: General Surgery

## 2023-09-06 VITALS — BP 136/88 | HR 67 | Temp 98.4°F | Ht 64.0 in | Wt 209.0 lb

## 2023-09-06 DIAGNOSIS — T792XXA Traumatic secondary and recurrent hemorrhage and seroma, initial encounter: Secondary | ICD-10-CM

## 2023-09-06 DIAGNOSIS — T792XXD Traumatic secondary and recurrent hemorrhage and seroma, subsequent encounter: Secondary | ICD-10-CM

## 2023-09-06 DIAGNOSIS — R221 Localized swelling, mass and lump, neck: Secondary | ICD-10-CM

## 2023-09-06 NOTE — Patient Instructions (Signed)
 Excision of Skin Lesions, Care After The following information offers guidance on how to care for yourself after your procedure. Your health care provider may also give you more specific instructions. If you have problems or questions, contact your health care provider. What can I expect after the procedure? After your procedure, it is common to have: Soreness or mild pain. Some redness and swelling. Follow these instructions at home: Excision site care  Follow instructions from your health care provider about how to take care of your excision site. Make sure you: Wash your hands with soap and water for at least 20 seconds before and after you change your bandage (dressing). If soap and water are not available, use hand sanitizer. Change your dressing as told by your health care provider. Leave stitches (sutures), skin glue, or adhesive strips in place. These skin closures may need to stay in place for 2 weeks or longer. If adhesive strip edges start to loosen and curl up, you may trim the loose edges. Do not remove adhesive strips completely unless your health care provider tells you to do that. Check the excision area every day for signs of infection. Watch for: More redness, swelling, or pain. Fluid or blood. Warmth. Pus or a bad smell. Keep the site clean, dry, and protected for at least 48 hours. For bleeding, apply gentle but firm pressure to the area using a folded towel for 20 minutes. Do not take baths, swim, or use a hot tub until your health care provider approves. Ask your health care provider if you may take showers. You may only be allowed to take sponge baths. General instructions Take over-the-counter and prescription medicines only as told by your health care provider. Follow instructions from your health care provider about how to minimize scarring. Scarring should lessen over time. Avoid sun exposure until the area has healed. Use sunscreen to protect the area from the sun  after it has healed. Avoid high-impact exercise and activities until the sutures are removed or the area heals. Keep all follow-up visits. This is important. Contact a health care provider if: You have more redness, swelling, or pain around your excision site. You have fluid or blood coming from your excision site. Your excision site feels warm to the touch. You have pus or a bad smell coming from your excision site. You have a fever. You have pain that does not improve in 2-3 days after your procedure. Get help right away if: You have bleeding that does not stop with pressure or a dressing. Your wound opens up. Summary Take over-the-counter and prescription medicines only as told by your health care provider. Change your dressing as told by your health care provider. Contact a health care provider if you have redness, swelling, pain, or other signs of infection around your excision site. Keep all follow-up visits. This is important. This information is not intended to replace advice given to you by your health care provider. Make sure you discuss any questions you have with your health care provider. Document Revised: 11/18/2020 Document Reviewed: 11/18/2020 Elsevier Patient Education  2024 [employer].

## 2023-09-06 NOTE — Progress Notes (Signed)
 Outpatient Surgical Follow Up  09/06/2023  Julia Jenkins is an 53 y.o. female.   Chief Complaint  Patient presents with   Routine Post Op    Excision mass left neck 08/29/23    HPI: The patient returns today status post excision of left neck seroma.  She has a drain in place.  She says the drain is not putting out very much and has only had to empty it twice over the last week.  She denies any nausea or vomiting.  She denies any unrelieved pain.  She does report some numbness to her left ear.  Past Medical History:  Diagnosis Date   Acquired hypothyroidism    Essential hypertension    Lipoma of neck    Mild obstructive sleep apnea    Obesity (BMI 30-39.9)     Past Surgical History:  Procedure Laterality Date   CESAREAN SECTION     EXCISION MASS NECK Left 08/29/2023   Procedure: EXCISION, MASS, NECK;  Surgeon: Barrett Lick, MD;  Location: [facility] ORS;  Service: General;  Laterality: Left;   LIPOMA EXCISION     Neck   TUBAL LIGATION     WISDOM TOOTH EXTRACTION      Family History  Problem Relation Age of Onset   Heart disease Maternal Grandmother    Hypertension Maternal Grandmother    Heart disease Maternal Grandfather    Hypertension Maternal Grandfather    Heart disease Paternal Grandmother    Hypertension Paternal Grandmother    Heart disease Paternal Grandfather    Heart attack Paternal Grandfather    Hypertension Paternal Grandfather    Hyperlipidemia Mother    Heart disease Father    Heart attack Father    Prostate cancer Father    Hypertension Father    Drug abuse Sister    Depression Sister    COPD Sister    Drug abuse Sister    Schizophrenia Sister    Personality disorder Sister     Social History:  reports that she has quit smoking. Her smoking use included cigarettes and e-cigarettes. She has been exposed to tobacco smoke. She has never used smokeless tobacco. She reports that she does not currently use alcohol. She reports that she does not use  drugs.  Allergies:  Allergies  Allergen Reactions   Cymbalta  [Duloxetine  Hcl] Other (See Comments)    Felt strange    Medications reviewed.    ROS Full ROS performed and is otherwise negative other than what is stated in HPI   BP 136/88   Pulse 67   Temp 98.4 F (36.9 C) (Oral)   Ht 5\' 4"  (1.626 m)   Wt 209 lb (94.8 kg)   SpO2 97%   BMI 35.87 kg/m   Physical Exam Left neck incision is healing well with glue on it.  There is some induration but no evidence of recurrence of seroma nor is there any hematoma or overlying bruising.  She is breathing easily on room air.  Her drain has scant sanguinous output without evidence of seroma or purulence.   Pathology discussed with patient and is benign  Assessment/Plan:  Patient's status post excision of left neck mass.  Her drain is minimal output.  This was removed intact in clinic today and dressed with a Band-Aid.  Discussed with her that she can follow-up as needed if there is recurrence of the seroma or swelling to the area.   Severa Daniels, M.D. [facility]

## 2023-10-04 ENCOUNTER — Encounter: Admitting: General Surgery

## 2023-10-06 ENCOUNTER — Encounter: Admitting: General Surgery

## 2023-10-11 ENCOUNTER — Ambulatory Visit (INDEPENDENT_AMBULATORY_CARE_PROVIDER_SITE_OTHER): Admitting: General Surgery

## 2023-10-11 ENCOUNTER — Encounter: Payer: Self-pay | Admitting: General Surgery

## 2023-10-11 VITALS — BP 139/84 | HR 73 | Temp 98.7°F | Ht 64.0 in | Wt 208.8 lb

## 2023-10-11 DIAGNOSIS — Z09 Encounter for follow-up examination after completed treatment for conditions other than malignant neoplasm: Secondary | ICD-10-CM

## 2023-10-11 DIAGNOSIS — T792XXD Traumatic secondary and recurrent hemorrhage and seroma, subsequent encounter: Secondary | ICD-10-CM

## 2023-10-11 DIAGNOSIS — R221 Localized swelling, mass and lump, neck: Secondary | ICD-10-CM

## 2023-10-11 DIAGNOSIS — T792XXA Traumatic secondary and recurrent hemorrhage and seroma, initial encounter: Secondary | ICD-10-CM

## 2023-10-11 NOTE — Patient Instructions (Addendum)
 Paresthesia Paresthesia is a burning or prickling feeling. This feeling can happen in any part of the body. It often happens in the hands, arms, legs, or feet. Usually, it is not painful. In most cases, the feeling goes away in a short time and is not a sign of a serious problem. If you have paresthesia that lasts a long time, you need to see your doctor. Follow these instructions at home: Nutrition Eat a healthy diet. This includes: Eating foods that are high in fiber. These include beans, whole grains, and fresh fruits and vegetables. Limiting foods that are high in fat and sugar. These include fried or sweet foods.  Alcohol use  Do not drink alcohol if: Your doctor tells you not to drink. You are pregnant, may be pregnant, or are planning to become pregnant. If you drink alcohol: Limit how much you have to: 0-1 drink a day for women. 0-2 drinks a day for men. Know how much alcohol is in your drink. In the [place] one drink equals one 12 oz bottle of beer (355 mL), one 5 oz glass of wine (148 mL), or one 1 oz glass of hard liquor (44 mL). General instructions Take over-the-counter and prescription medicines only as told by your doctor. Do not smoke or use any products that contain nicotine or tobacco. If you need help quitting, ask your doctor. If you have diabetes, work with your doctor to make sure your blood sugar stays in a healthy range. If your feet feel numb: Check for redness, warmth, and swelling every day. Wear padded socks and comfortable shoes. These help protect your feet. Keep all follow-up visits. Contact a doctor if: You have paresthesia that gets worse or does not go away. You lose feeling (have numbness) after an injury. Your burning or prickling feeling gets worse when you walk. You have pain or cramps. You feel dizzy or you faint. You have a rash. Get help right away if: You feel weak or have new weakness in an arm or leg. You have trouble walking or  moving. You have problems speaking, understanding, or seeing. You feel confused. You cannot control when you pee (urinate) or poop (have a bowel movement). These symptoms may be an emergency. Get help right away. Call 911. Do not wait to see if the symptoms will go away. Do not drive yourself to the hospital. Summary Paresthesia is a burning or prickling feeling. It often happens in the hands, arms, legs, or feet. In most cases, the feeling goes away in a short time and is not a sign of a serious problem. If you have paresthesia that lasts a long time, you need to be seen by your doctor. This information is not intended to replace advice given to you by your health care provider. Make sure you discuss any questions you have with your health care provider. Document Revised: 12/29/2020 Document Reviewed: 12/29/2020 Elsevier Patient Education  2024 [employer].

## 2023-10-19 NOTE — Progress Notes (Signed)
 The patient returns after having a large seroma from previous lipoma excision resected.  Overall she reports doing well she says that she has not noticed any recurrence of the seroma.  However, she reports that she has some numbness in her left ear.  She says that she can feel when there is pressure and sharp pain but the sensation is diminished.  She reports that it is hard to tell if her pillow was cool at night when she places her left ear on this.  She says this extends to her lower jawline and even to her chin.  She denies any motor problems or problems with hearing.  On exam the left cervical incision has healed well.  There is no evidence of recurrence of the large seroma.  She has feeling to light sensation and pain at her chin and extending laterally up until the mandibular angle.  She has diminished sensation to pinpoint sensation and light touch along the anterior part of her ear.  I discussed with her that this is concerning for distribution of the greater auricular nerve.  She does have some sensation although it is diminished so this could be secondary to stretch or partial disruption of the nerve.  I discussed with her that since this is sensory nerve there is not a lot of interventions that may be pursued in addition we would have to wait several months to see if she regains any sensation.  I offered her a referral to [facility] for possible EMG however she says is not bothering her that much where she would want to do anything drastic for it.  I discussed with her that the nerve may regenerate but this again may take months to years.  She understands this and will follow-up with us  as needed
